# Patient Record
Sex: Male | Born: 1960 | Race: White | Hispanic: No | Marital: Married | Smoking: Never smoker
Health system: Southern US, Community
[De-identification: ages and names within clinical notes are randomized; demographics above are authoritative.]

## PROBLEM LIST (undated history)

## (undated) DIAGNOSIS — Z955 Presence of coronary angioplasty implant and graft: Secondary | ICD-10-CM

## (undated) DIAGNOSIS — I219 Acute myocardial infarction, unspecified: Secondary | ICD-10-CM

## (undated) DIAGNOSIS — I493 Ventricular premature depolarization: Secondary | ICD-10-CM

## (undated) DIAGNOSIS — I341 Nonrheumatic mitral (valve) prolapse: Secondary | ICD-10-CM

## (undated) DIAGNOSIS — I2510 Atherosclerotic heart disease of native coronary artery without angina pectoris: Secondary | ICD-10-CM

## (undated) HISTORY — PX: APPENDECTOMY: SHX54

## (undated) HISTORY — PX: CHOLECYSTECTOMY: SHX55

## (undated) HISTORY — DX: Ventricular premature depolarization: I49.3

## (undated) HISTORY — PX: HERNIA REPAIR: SHX51

## (undated) HISTORY — DX: Presence of coronary angioplasty implant and graft: Z95.5

## (undated) HISTORY — DX: Acute myocardial infarction, unspecified: I21.9

## (undated) HISTORY — PX: CARDIAC CATHETERIZATION: SHX172

## (undated) HISTORY — DX: Nonrheumatic mitral (valve) prolapse: I34.1

## (undated) HISTORY — PX: URETHRAL DILATION: SUR417

## (undated) HISTORY — PX: APPENDECTOMY (OPEN): SHX54

## (undated) HISTORY — DX: Atherosclerotic heart disease of native coronary artery without angina pectoris: I25.10

---

## 2013-07-26 DIAGNOSIS — I499 Cardiac arrhythmia, unspecified: Secondary | ICD-10-CM

## 2013-07-26 HISTORY — DX: Cardiac arrhythmia, unspecified: I49.9

## 2013-08-30 DIAGNOSIS — M5090 Cervical disc disorder, unspecified, unspecified cervical region: Secondary | ICD-10-CM | POA: Insufficient documentation

## 2013-08-30 DIAGNOSIS — I341 Nonrheumatic mitral (valve) prolapse: Secondary | ICD-10-CM | POA: Insufficient documentation

## 2013-08-30 DIAGNOSIS — N419 Inflammatory disease of prostate, unspecified: Secondary | ICD-10-CM | POA: Insufficient documentation

## 2013-08-30 DIAGNOSIS — J309 Allergic rhinitis, unspecified: Secondary | ICD-10-CM | POA: Insufficient documentation

## 2013-08-30 DIAGNOSIS — H9190 Unspecified hearing loss, unspecified ear: Secondary | ICD-10-CM | POA: Insufficient documentation

## 2013-08-30 DIAGNOSIS — I491 Atrial premature depolarization: Secondary | ICD-10-CM | POA: Insufficient documentation

## 2014-08-01 DIAGNOSIS — I4729 Other ventricular tachycardia: Secondary | ICD-10-CM | POA: Insufficient documentation

## 2014-08-01 DIAGNOSIS — I472 Ventricular tachycardia: Secondary | ICD-10-CM | POA: Insufficient documentation

## 2014-08-01 DIAGNOSIS — I493 Ventricular premature depolarization: Secondary | ICD-10-CM | POA: Insufficient documentation

## 2014-09-05 DIAGNOSIS — D472 Monoclonal gammopathy: Secondary | ICD-10-CM | POA: Insufficient documentation

## 2014-09-05 DIAGNOSIS — H5210 Myopia, unspecified eye: Secondary | ICD-10-CM | POA: Insufficient documentation

## 2015-01-17 ENCOUNTER — Ambulatory Visit (INDEPENDENT_AMBULATORY_CARE_PROVIDER_SITE_OTHER): Payer: Self-pay | Admitting: Cardiovascular Disease

## 2015-03-07 ENCOUNTER — Other Ambulatory Visit: Payer: Self-pay | Admitting: Cardiovascular Disease

## 2015-03-07 DIAGNOSIS — I341 Nonrheumatic mitral (valve) prolapse: Secondary | ICD-10-CM

## 2015-03-17 ENCOUNTER — Ambulatory Visit
Admission: RE | Admit: 2015-03-17 | Discharge: 2015-03-17 | Disposition: A | Discharge status: Home / Self Care | Payer: Commercial Managed Care - POS | Source: Ambulatory Visit | Attending: Cardiovascular Disease | Admitting: Cardiovascular Disease

## 2015-03-17 ENCOUNTER — Ambulatory Visit (INDEPENDENT_AMBULATORY_CARE_PROVIDER_SITE_OTHER): Payer: Self-pay

## 2015-03-17 ENCOUNTER — Other Ambulatory Visit (INDEPENDENT_AMBULATORY_CARE_PROVIDER_SITE_OTHER): Payer: Self-pay

## 2015-03-17 DIAGNOSIS — I341 Nonrheumatic mitral (valve) prolapse: Secondary | ICD-10-CM

## 2015-03-17 DIAGNOSIS — I493 Ventricular premature depolarization: Secondary | ICD-10-CM | POA: Insufficient documentation

## 2016-01-14 LAB — VAHRT HISTORIC LVEF: Ejection Fraction: 65 %

## 2016-01-15 ENCOUNTER — Ambulatory Visit (INDEPENDENT_AMBULATORY_CARE_PROVIDER_SITE_OTHER): Payer: Self-pay | Admitting: Cardiovascular Disease

## 2016-03-17 ENCOUNTER — Other Ambulatory Visit: Payer: Self-pay | Admitting: Cardiovascular Disease

## 2016-03-17 DIAGNOSIS — I341 Nonrheumatic mitral (valve) prolapse: Secondary | ICD-10-CM

## 2016-03-24 ENCOUNTER — Ambulatory Visit (INDEPENDENT_AMBULATORY_CARE_PROVIDER_SITE_OTHER): Payer: Self-pay

## 2016-03-24 ENCOUNTER — Ambulatory Visit
Admission: RE | Admit: 2016-03-24 | Discharge: 2016-03-24 | Disposition: A | Discharge status: Home / Self Care | Payer: Commercial Managed Care - POS | Source: Ambulatory Visit | Attending: Cardiovascular Disease | Admitting: Cardiovascular Disease

## 2016-03-24 DIAGNOSIS — I493 Ventricular premature depolarization: Secondary | ICD-10-CM | POA: Insufficient documentation

## 2016-03-24 DIAGNOSIS — I341 Nonrheumatic mitral (valve) prolapse: Secondary | ICD-10-CM | POA: Insufficient documentation

## 2016-11-02 ENCOUNTER — Ambulatory Visit (INDEPENDENT_AMBULATORY_CARE_PROVIDER_SITE_OTHER): Payer: Self-pay | Admitting: Nurse Practitioner

## 2017-03-01 ENCOUNTER — Ambulatory Visit (INDEPENDENT_AMBULATORY_CARE_PROVIDER_SITE_OTHER): Payer: Self-pay

## 2017-03-01 ENCOUNTER — Other Ambulatory Visit (INDEPENDENT_AMBULATORY_CARE_PROVIDER_SITE_OTHER): Payer: Self-pay

## 2017-03-14 ENCOUNTER — Other Ambulatory Visit (INDEPENDENT_AMBULATORY_CARE_PROVIDER_SITE_OTHER): Payer: Self-pay

## 2017-03-14 ENCOUNTER — Ambulatory Visit (INDEPENDENT_AMBULATORY_CARE_PROVIDER_SITE_OTHER): Payer: Self-pay

## 2017-06-09 LAB — VAHRT HISTORIC LVEF: Ejection Fraction: 60 %

## 2017-06-10 ENCOUNTER — Other Ambulatory Visit (INDEPENDENT_AMBULATORY_CARE_PROVIDER_SITE_OTHER): Payer: Self-pay

## 2017-06-10 ENCOUNTER — Ambulatory Visit (INDEPENDENT_AMBULATORY_CARE_PROVIDER_SITE_OTHER): Payer: Self-pay

## 2019-02-07 ENCOUNTER — Ambulatory Visit (INDEPENDENT_AMBULATORY_CARE_PROVIDER_SITE_OTHER): Payer: Self-pay | Admitting: Cardiovascular Disease

## 2019-03-14 ENCOUNTER — Ambulatory Visit (INDEPENDENT_AMBULATORY_CARE_PROVIDER_SITE_OTHER): Payer: Self-pay

## 2019-06-28 ENCOUNTER — Ambulatory Visit (INDEPENDENT_AMBULATORY_CARE_PROVIDER_SITE_OTHER): Payer: Self-pay | Admitting: Cardiovascular Disease

## 2020-06-18 ENCOUNTER — Encounter (INDEPENDENT_AMBULATORY_CARE_PROVIDER_SITE_OTHER): Payer: Self-pay

## 2020-06-23 ENCOUNTER — Encounter (INDEPENDENT_AMBULATORY_CARE_PROVIDER_SITE_OTHER): Payer: Self-pay | Admitting: Cardiovascular Disease

## 2020-06-24 ENCOUNTER — Encounter (INDEPENDENT_AMBULATORY_CARE_PROVIDER_SITE_OTHER): Payer: Self-pay | Admitting: Cardiovascular Disease

## 2020-06-24 ENCOUNTER — Telehealth (INDEPENDENT_AMBULATORY_CARE_PROVIDER_SITE_OTHER): Payer: Commercial Managed Care - POS | Admitting: Cardiovascular Disease

## 2020-06-24 VITALS — BP 120/80 | Ht 68.0 in | Wt 155.0 lb

## 2020-06-24 DIAGNOSIS — I341 Nonrheumatic mitral (valve) prolapse: Secondary | ICD-10-CM

## 2020-06-24 DIAGNOSIS — I493 Ventricular premature depolarization: Secondary | ICD-10-CM

## 2020-06-24 DIAGNOSIS — I4729 Other ventricular tachycardia: Secondary | ICD-10-CM

## 2020-06-24 DIAGNOSIS — I472 Ventricular tachycardia: Secondary | ICD-10-CM

## 2020-06-24 MED ORDER — PROPRANOLOL HCL 10 MG PO TABS
10.0000 mg | ORAL_TABLET | Freq: Two times a day (BID) | ORAL | 3 refills | Status: DC | PRN
Start: 2020-06-24 — End: 2020-07-07

## 2020-06-24 NOTE — Progress Notes (Signed)
HEART CARDIOLOGY OFFICE PROGRESS NOTE    [facility] OFFICE -CARDIOLOGY  [address]  [place] [address]  Dept: [phone]  Dept Fax: [phone]       Patient Name: Stephen Bishop, Stephen Bishop    Date of Visit:  June 24, 2020  Date of Birth: 1960/09/26  AGE: 59 y.o.  Medical Record #: [id number]  Requesting Physician: Driscilla Grammes, MD    Stephen Bishop presents for routine scheduled follow up.  He is a 59 y.o. M followed by Dr. Donnie Coffin.  H/o PVCs including prior ablation [facility]).  Some PVCs persist.  Also, MVP with MR.  Mildly thickened leaflets.  Normal LVEF.  At his last visit with Dr. Donnie Coffin his prior long acting propranolol was switched over to short acting (low dose) so it could be adjusted by Mr. Deleeuw as needed.      Since his last visit he is doing well.  Normal exercise tolerance.  Some palpitations at night.  Mild.  Not really using the beta-blocker at this point but does feel he would like a fresh prescription to have and fill if needed.  No complaints today.     PAST MEDICAL HISTORY: He has a past medical history of Arrhythmia (2015), Mitral valve prolapse, nonrheumatic, and Premature ventricular contraction. He has a past surgical history that includes Urethral dilation; Hernia repair; Appendectomy; Cholecystectomy; and Cardiac electrophysiology study and ablation (12/2014).    ALLERGIES:   Allergies   Allergen Reactions   . Sulfa Antibiotics Rash     Other reaction(s): Intolerance-unknown       MEDICATIONS:   No current outpatient medications on file.        FAMILY HISTORY: family history includes Atrial fibrillation in his father; Mitral valve prolapse in his father; Valve Surgery in his brother.    SOCIAL HISTORY: He reports that he has never smoked. He has never used smokeless tobacco. He reports previous alcohol use. He reports that he does not use drugs.    PHYSICAL EXAMINATION    Visit Vitals  BP 120/80   Ht 1.727 m (5\' 8" )    Wt 70.3 kg (155 lb)   BMI 23.57 kg/m     A/O X 3.  NAD.    No pallor or jaundice.  No JVD or TM.  Mood and affect WNL.  Speaking full sentences without dyspnea.  Moving all extremities.  No LE edema.      IMPRESSION:   1.   H/o PVCs, highly symptomatic, status post ablation at [facility] June 2016.     Documentation of PVCs and nonsustained ventricular tachycardia (up to five beats)    with right bundle/inferior axis morphology consistent with a left cusp versus aortomitral    continuity origin.  Intolerant of Bystolic, metoprolol, and Rythmol due to side effects.  Some likely     PVCs persist at night (possibly bradycardia dependent), particularly when under stress (possibly    catecholamine dependent).  Much lower level.  Not bothersome.  PRN beta-blocker.    2.   No known history of CAD.         a.   Coronary calcium scan 2014, total score zero.         b.   Normal stress nuclear study performed with another cardiologist in      [facility] January 2015 with normal EF.         c.   No anginal complaints.  3.  Mitral valve prolapse with MR.      a. Echocardiogram January 2015, bi-leaflet mitral valve      prolapse with moderate MR.  EF 60%.  Normal left atrial size.  Normal LV size.    b Echo 2016 EF - Normal. Bileaflet MVP. Mild to moderate MR.    c Echo 2017 EF 65% Moderate MR    d Echo 2018 EF 60% Mild to moderate MR    e Echo 2020 (interpreted by Dr. Hyacinth Meeker).  EF 60%. He notes mitral valve is minimally    thickened though reports no mitral valve prolapse.  Mild mitral regurgitation.    RECOMMENDATIONS:  Regarding his PVCs I will refill his prn propranolol 10 mg.  He requests 30 to use as a prn for a 3 month supply.  3 refills (1 year).  He will call if needing more.  Regarding his MVP I will arrange an echo.  Before the end of the year.  Once done, he will contact Dr. Donnie Coffin to review.  Otherwise, I will make the routine f/u visit with Dr. Donnie Coffin for a year.                                                    No orders of the defined types were placed in this encounter.      No orders of the defined types were placed in this encounter.      SIGNED:    Bertram Millard Ileigh Mettler, MD          This note was generated by the Dragon speech recognition and may contain errors or omissions not intended by the user. Grammatical errors, random word insertions, deletions, pronoun errors, and incomplete sentences are occasional consequences of this technology due to software limitations. Not all errors are caught or corrected. If there are questions or concerns about the content of this note or information contained within the body of this dictation, they should be addressed directly with the author for clarification.

## 2020-07-06 ENCOUNTER — Other Ambulatory Visit (INDEPENDENT_AMBULATORY_CARE_PROVIDER_SITE_OTHER): Payer: Self-pay | Admitting: Cardiovascular Disease

## 2020-07-06 DIAGNOSIS — I493 Ventricular premature depolarization: Secondary | ICD-10-CM

## 2020-07-06 DIAGNOSIS — I341 Nonrheumatic mitral (valve) prolapse: Secondary | ICD-10-CM

## 2020-07-10 ENCOUNTER — Telehealth (INDEPENDENT_AMBULATORY_CARE_PROVIDER_SITE_OTHER): Payer: Self-pay

## 2020-07-10 MED ORDER — ACETAZOLAMIDE 125 MG PO TABS
125.0000 mg | ORAL_TABLET | Freq: Two times a day (BID) | ORAL | 3 refills | Status: DC | PRN
Start: 2020-07-10 — End: 2020-12-31

## 2020-07-10 NOTE — Addendum Note (Signed)
Addended by: Ellard Artis. on: 07/10/2020 03:57 PM     Modules accepted: Orders

## 2020-07-10 NOTE — Telephone Encounter (Signed)
Mr. Stephen Bishop called to have his Acetazolamide 125mg  BID 1 day before reaching high altitude refilled because he is going back to [place] soon, specifically to [place], [place] which is approximately 9,400 feet above sea level.     When he goes to high altitudes, he gets tachy/ mountain sickness and has frequent PVC's so Dr. Donnie Coffin rx'ed this for him.      It was initially rx'ed on Jun 24, 2015 by Dr. Donnie Coffin when he went to [place].  (Please see GEMMS MR [id number].  GEMMS Message 06/23/15 16:55:55 between RN and LRR )    Dr. Morrie Sheldon whom he saw recently is not in clinic today.      I will ask our AOD if she can give me orders.

## 2020-07-10 NOTE — Telephone Encounter (Signed)
Relayed msg to pt.  Pt voiced understanding.

## 2020-07-10 NOTE — Telephone Encounter (Signed)
I looked at his recent labs which electrolytes are normal and creatinine hasn't changed much since 2016. Okay for same prescription acetazolamide 125mg  bid starting 1 day prior. I agree with recommendations back in 2016 for monitoring for adequate hydration and also limiting alcohol intake.  Thanks!

## 2020-07-17 ENCOUNTER — Encounter (INDEPENDENT_AMBULATORY_CARE_PROVIDER_SITE_OTHER): Payer: Self-pay | Admitting: Cardiovascular Disease

## 2020-07-17 ENCOUNTER — Ambulatory Visit (INDEPENDENT_AMBULATORY_CARE_PROVIDER_SITE_OTHER): Payer: Commercial Managed Care - POS

## 2020-07-17 DIAGNOSIS — I341 Nonrheumatic mitral (valve) prolapse: Secondary | ICD-10-CM

## 2020-07-17 DIAGNOSIS — I493 Ventricular premature depolarization: Secondary | ICD-10-CM

## 2020-07-18 ENCOUNTER — Other Ambulatory Visit (INDEPENDENT_AMBULATORY_CARE_PROVIDER_SITE_OTHER): Payer: Self-pay | Admitting: Cardiovascular Disease

## 2020-07-18 DIAGNOSIS — I493 Ventricular premature depolarization: Secondary | ICD-10-CM

## 2020-07-18 DIAGNOSIS — I341 Nonrheumatic mitral (valve) prolapse: Secondary | ICD-10-CM

## 2020-08-06 ENCOUNTER — Telehealth (INDEPENDENT_AMBULATORY_CARE_PROVIDER_SITE_OTHER): Payer: Self-pay | Admitting: Cardiovascular Disease

## 2020-10-28 NOTE — Progress Notes (Signed)
 OFFICE  [address] [place], [place] [address]     TELEMEDICINE VISIT       Stephen Bishop    Date of Visit:  02/07/2019  Date of Birth: 03-16-1961  Age: 60 yrs.   Medical Record Number: [id number]  __  CURRENT DIAGNOSES     1. Mitral valve prolapse nonrheumatic,  I34.1  2. 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 Vitamin C 1,000 mg tablet, 1 po qd  2. Vitamin D3 1,000 unit tablet, 1 po qd  3. magnesium 250 mg tablet, 2 po qd   4. Citracal + Bone Density 300 mg-200 unit-13.5 mg tablet, 2 po q d  5. propranolol ER 60 mg capsule,24 hr,extended release, 1 po qd  __  CHIEF COMPLAINT/REASON FOR VISIT   Followup of Mitral valve prolapse nonrheumatic and Followup of PVCs  __  HISTORY OF PRESENT ILLNESS  The visit today was conducted via telemedicine  due to COVID-19 precautions. The patient was in their home and was informed and gave verbal consent to proceed. Mr. Debruhl was evaluated by telemedicine. He reports very significant work-related stress, and in this setting noted increase in his palpitations.  He was started on extended release propranolol by his primary care physician. He is also interested in a followup echocardiogram. He has not been exercising regularly.   __  PHYSICAL EXAMINATION     Vital Signs:  Blood Pressure:      Weight:  160.00 lbs.  Height: 68.00"  BMI: 24.33    Pulse: 66/min.       IMPRESSIONS:  Telemedicine follow-up visit. Increased palpitations noted  in the setting of significant work related stress.   Restarted on propranolol ER by his primary care physician.    1. PVCs, highly symptomatic status post ablation at [facility] June 2016.   Documentation of PVCs and nonsustained  ventricular tachycardia (up to five beats)   with right bundle/inferior axis morphology consistent with a left cusp versus aortomitral   continuity origin. Intolerant of Bystolic, metoprolol, and Rythmol due  to side effects.  2. No known history  of CAD.  a. Coronary calcium scan 2014, total score zero.  b. Normal stress nuclear study performed with another cardiologist in   [facility] January 2015 with normal EF.  c. No anginal complaints.  3. Mitral valve prolapse  with MR.   a. Echocardiogram January 2015, bi-leaflet mitral valve   prolapse with moderate MR. EF 60%. Normal left atrial size. Normal LV size.  b Echo 2016 EF - Normal. Bileaflet MVP. Mild to moderate MR.  c Echo 2017 EF 65% Moderate  MR  d Echo 2018 EF 60% Mild to moderate MR    RECOMMENDATIONS:  1. Continue propranolol as prescribed, though would change dosing to later in the day as he notes more prominent symptoms overnight.   2. Regular exercise encouraged.    3. Echocardiogram (at patient request).  4. Return visit in one year or sooner as needed.     Evianna Chandran R. Donnie Coffin, MD, [facility]    LRR/tumam     cc: Driscilla Grammes MD  ____________________________  TODAYS ORDERS   2D, color flow, doppler echocardiogram [facility]  Return Visit 30 MIN 1 year

## 2020-10-28 NOTE — Progress Notes (Signed)
Stephen Bishop OFFICE  [address] [place], [place] [address]     Stephen Bishop    Date of Visit:  11/02/2016  Date of Birth: [birth year]  Age: 60 yrs.   Medical Record Number: [id number]  __  CURRENT DIAGNOSES     1. Mitral valve prolapse nonrheumatic,  I34.1  2. 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 Vitamin C 1,000 mg tablet, 1 po qd  2. Vitamin D3 1,000 unit tablet, 1 po qd  3. magnesium 250 mg tablet, 2 po qd   4. acetazolamide 125 mg tablet, 1 po BID start 1 day before reaching altitude  5. multivitamin tablet, 1 po q d  6. Citracal + Bone Density 300 mg-200 unit-13.5 mg tablet, 2 po q d  __   CHIEF COMPLAINT/REASON FOR VISIT  Followup of PVCs  __  HISTORY OF PRESENT ILLNESS  Stephen Bishop  is a 60 year old gentleman who comes to the office today with a symptom complaint of increased premature ventricular contractions. He is primarily followed by Dr. Michell Heinrich for a cardiac history to include mitral valve prolapse with premature ventricular  contractions status post EP study and left ventricular outflow tract ablation at [facility] December 30, 2014.     Stephen Bishop tells me that he has noticed increased frequency of his premature ventricular contractions. At times he feels  a trigeminal pattern, and if that is the case, he notices increased shortness of breath. He denies chest pain, lightheadedness or dizziness. He appears euvolemic on physical exam.   Stephen Bishop most recent echocardiogram, completed on March 24, 2016, showed normal left ventricular systolic function with left a ventricular ejection fraction of 60%, mild mitral valve prolapse, moderate mitral regurgitation, grade I diastolic  dysfunction. Blood pressure at the office today was 124 to 130 over 80. A 12-lead ECG showed normal sinus rhythm at a rate of 76 without ectopy. Labs that the patient had with him from September 22, 2016 were  reviewed, showing a sodium level of 140, chloride  of 102, potassium of 4.3, CO2 of 24, BUN of 18, creatinine 1.3 and magnesium level of 2.4.     PAST HISTORY      Past Medical Illnesses: No previous history of significant medical illnesses.;  Past Cardiac Illnesses : PVC's, MVP; Infectious Diseases: Chicken Pox; Surgical Procedures : Urethral stricture, Hernia,Appendectomy; Trauma History: No previous history of significant trauma.;  Cardiology Procedures-Invasive: Ablation; Cardiology Procedures-Noninvasive: MPI (Nuclear) Study;  Left Ventricular Ejection Fraction: LVEF of 65% documented via echocardiogram on 03/17/2015  ___   FAMILY HISTORY  Brother -- Repair of mitral valve   Father -- Atrial fibrillation, Mitral valve regurgitation    __  CARDIAC RISK FACTORS      Tobacco Abuse: has never used tobacco; Family History of Heart Disease: positive;  Hyperlipidemia: negative; Hypertension: negative;   Diabetes Mellitus: negative; Prior History of Heart Disease: positive;  Obesity: negative; Sedentary Life Style:negative; Age :positive; Menopausal:not applicable  __  SOCIAL HISTORY     Alcohol Use: drinks occasionally and socially; Smoking: Does not smoke; Never smoker ([id number]);  Diet: Regular diet; Exercise: Exercises regularly three times a week;   __   PHYSICAL EXAMINATION    Vital Signs:  Blood Pressure:   130/80 Sitting, Left arm, regular cuff  124/80 Sitting, Right arm, regular cuff    Weight: 166.00 lbs.   Height: 68"  BMI:  25   Pulse:  76/min.   Respirations: 14/min.     Constitutional:  Well developed, well nourished, in no acute distress Skin: warm and dry to touch Head : normocephalic, normal male hair pattern Eyes: conjunctivae and lids normal  ENT: Ears, Nose and throat reveal no gross abnormalities Neck : no JVD, no bruits Chest: clear to auscultation bilaterally  Cardiac: Regular rhythm, S1 normal, S2 normal, No S3 or S4, Apical impulse not displaced, no murmurs, gallops or rubs detected. Abdomen :  unremarkable Peripheral Pulses: pulses full and equal in all extremities  Extremities/Back: no edema present Neurological: oriented  to time, person and place _    Medications added today by the physician:    IMPRESSIONS:   1. Premature ventricular contractions, symptomatic, status post ablation at [facility] is June of 2016.   a. Documentation of premature ventricular contractions nonsustained ventricular   tachycardia up to five beats  with right bundle/inferior axis morphology   consistent with left cusp versus aortomitral continuity origin.   b. Intolerance of Bystolic, metoprolol and Rythmol secondary to undesired side   effects.   c. The patient has noticed increased  frequency and duration of palpitations of late.   2. Mitral valve prolapse with mitral regurgitation.   a. Echocardiogram in January of 2015 showing a bileaflet mitral valve prolapse   and moderate mitral regurgitation. Ejection fraction 60%.  Normal left atrial size,   normal left ventricular size.   b. Echocardiogram August of 2016 again showing bileaflet mitral valve prolapse,   mild-to-moderate mitral regurgitation, normal left ventricular ejection fraction.      PLAN AND RECOMMENDATIONS:  1. Stephen Bishop will have a Holter monitor placed today.   2. It was recommended that he discontinue any caffeine consumption and maintain adequate hydration.  3.  Consider repeat echocardiogram prior to annual surveillance (August 2018) pending results of Holter.     Stephen Bishop, AGACNP-BC    ED/cbh     cc: Driscilla Grammes MD    SHJ  ____________________________  TODAYS ORDERS   Holter Monitor Today  Diet mgmt edu, guidance and counseling TODAY  12 Lead ECG Today

## 2020-10-28 NOTE — H&P (Signed)
[place] OFFICE  [address] [place], [place] [address]     Valentina Shaggy    Date of Visit:  01/17/2015  Date of Birth: February 04, 1961  Age: 60 yrs.   Medical Record Number: [id number]  Referring Physician: [facility] MD, Stephanie Coup.  __   CURRENT DIAGNOSES     1. Mitral valve prolapse nonrheumatic, I34.1  2. PVCs, I49.3  __  ALLERGIES     Sulfa (Sulfonamide Antibiotics), Intolerance-unknown  __  MEDICATIONS     1. Vitamin C 1,000  mg tablet, 1 po qd  2. Vitamin D3 1,000 unit tablet, 1 po qd  3. magnesium 250 mg tablet, 2 po qd  __  CHIEF COMPLAINT/REASON FOR VISIT   Followup of PVCs and Mitral Valve Prolapse  __  HISTORY OF PRESENT ILLNESS  Mr. Pilar is a 60 year old man seen in the office to establish  ongoing cardiac care. He has a history of PVCs status post ablation at [facility] and has a history of mitral valve prolapse with moderate MR noted by most recent echocardiogram. Following his recent ablation, he reports that he is feeling extremely  well. He had noted frequent palpitations though reports PVCs are very rare. He had been seen by multiple cardiologists and had been tried on medications including Bystolic, metoprolol, and Rythmol though he did not tolerate these medications and in the  setting of continued symptomatic palpitations underwent successful ablation. He does exercise. He has not had exertional chest pain or shortness of breath and denies PND, orthopnea, or claudication. Overall, he is feeling well.    _   PAST HISTORY     Past Medical Illnesses: No previous history of significant medical illnesses.;   Past Cardiac Illnesses: PVC's, MVP; Infectious Diseases : Chicken Pox; Surgical Procedures: Urethral stricture, Hernia,Appendectomy; Trauma History : No previous history of significant trauma.; Cardiology Procedures-Invasive: Ablation; Cardiology  Procedures-Noninvasive: MPI (Nuclear) Study  ___  FAMILY HISTORY   Brother -- Repair of mitral valve  Father -- Atrial  fibrillation, Mitral valve regurgitation     __  CARDIAC RISK FACTORS     Tobacco Abuse: has never used tobacco; Hyperlipidemia : negative; Hypertension: negative;  Diabetes Mellitus : negative; Prior History of Heart Disease: positive; Obesity : negative; Sedentary Life Style:negative; [id number]   __  SOCIAL HISTORY    Alcohol Use : drinks occasionally and socially; Smoking: Does not smoke; Never smoker ([id number]); Diet : Regular diet; Exercise: Exercises regularly three times a week;   __  REVIEW OF SYSTEMS     General: Denies recent weight loss, weight gain, fever or chills or change in exercise tolerance.; Integumentary : Denies any change in hair or nails, rashes, or skin lesions.; Eyes: wears eye glasses/contact lenses;  Ears, Nose, Throat, Mouth: Denies any hearing loss, epistaxis, hoarseness or difficulty speaking.;Respiratory : Denies dyspnea, cough, wheezing or hemoptysis.; Cardiovascular: Please review HPI; Abdominal  : Denies ulcer disease, hematochezia or melena.;Musculoskeletal:Denies any venous insufficiency, arthritic symptoms or back problems.;  Neurological : Denies any recurrent strokes, TIA, or seizure disorder.; Psychiatric: Denies any depression,  substance abuse or change in cognitive functions.; Endocrine: Denies any weight change, heat/cold intolerance, polydipsia, or polyuria;  Hematologic/Immunologic: Denies any food allergies, seasonal allergies, bleeding disorders.  __  PHYSICAL EXAMINATION     Vital Signs:  Blood Pressure:  124/80 Sitting, Right arm, regular cuff  118/80 Sitting, Left arm, regular cuff     Weight: 158.00 lbs.  Height: 68"  BMI:  24   Pulse: 72/min.  Constitutional:  Well developed, well nourished, in no acute distress  Head: normocephalic  Eyes: conjunctivae and lids normal ENT: Ears, Nose and throat reveal no gross abnormalities  Neck: no JVD, no bruits Chest: clear to auscultation bilaterally  Cardiac: Regular rhythm, S1 normal, S2 normal, No S3 or S4, Apical  impulse not displaced, no murmurs, gallops or rubs detected. Abdomen : abdomen normal, abdomen soft, bowel sounds normoactive, no abnormal aortic pulsation, non-tender, no bruits Peripheral Pulses: pulses full and equal  in all extremities Extremities/Back: no edema present Neurological : oriented to time, person and place   __    Medications added today by the physician:      ECG:  Normal sinus rhythm.     IMPRESSIONS:  1. PVCs, highly symptomatic status post ablation at [facility] June 2016.   Documentation of PVCs and nonsustained ventricular tachycardia (up to five beats)   with right bundle/inferior axis morphology consistent  with a left cusp versus aortomitral   continuity origin. Intolerant of Bystolic, metoprolol, and Rythmol due to side effects.  2. No known history of CAD.  a. Coronary calcium scan 2014, total score zero.  b. Normal stress nuclear study  performed with another cardiologist in   [facility] January 2015 with normal EF.  c. No anginal complaints.  3. Mitral valve prolapse with MR. Echocardiogram January 2015, bi-leaflet mitral valve   prolapse with moderate MR. EF  60%. Normal left atrial size. Normal LV size.    RECOMMENDATIONS:  1. Echocardiogram to reassess MR.   2. Anticipate yearly echo studies as requested by Mr. Plack.    Varnell Orvis R. Donnie Coffin, MD, [facility]     Tid: [id number]     cc: Driscilla Grammes MD    rw      ____________________________  TODAYS ORDERS  12 Lead ECG Today  Patient Electronic  Access Today  2D, color flow, doppler First Available        Michell Heinrich, MD, [facility]

## 2020-10-28 NOTE — Progress Notes (Signed)
[facility]  [address] [place], [place] [address]     Valentina Shaggy    Date of Visit:  06/10/2017  Date of Birth: May 30, 1961  Age: 60 yrs.   Medical Record Number: [id number]  __  CURRENT DIAGNOSES     1. Mitral valve prolapse nonrheumatic,  I34.1  2. 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 acetazolamide 125 mg tablet, 1 po BID start 1 day before reaching altitude  2. Citracal + Bone Density 300 mg-200 unit-13.5  mg tablet, 2 po q d  3. magnesium 250 mg tablet, 2 po qd  4. propranolol 10 mg tablet, 1 po q8h  5. Vitamin C 1,000 mg tablet, 1 po qd  6. Vitamin D3 1,000 unit tablet, 1 po qd  __   CHIEF COMPLAINT/REASON FOR VISIT  Followup of PVCs  __  HISTORY OF PRESENT ILLNESS  Stephen Bishop  is a 60 year old male who returns for electrophysiology followup. The patient has a history of mitral valve prolapse with an accelerating burden of symptomatic PVCs, with a 17% burden on Holter monitor. He was seen by two electrophysiologists in the [facility] and ultimately was self referred to [facility] where he was taken to the EP lab by Dr. Nena Polio. PVCs were mapped to the superolateral aspect of the mitral valve ring. An ablation was performed at the site as well as reinforcing lesions  on the overlying coronary sinus. He noted dramatic improvement of symptoms following the ablation. I saw him in the summer because he was having increase of palpitations with a Holter monitor showing a 4.0% PVC burden. He was started on propranolol and  his symptoms dissipated. When I saw the patient we recommended discontinuation of propranolol as well as a repeat echocardiogram. His echocardiogram shows ongoing preserved LV function. He is here for followup. The patient describes that with discontinuation  of propranolol he had worsening palpitations again and had restarted with a taper and is now on 10 mg, taking it at  night. He has rare, episodic palpitations lasting seconds, usually at night, but overall feels great on a day to day basis. All other review  of systems is negative.  __  PAST HISTORY     Past Medical Illnesses : No previous history of significant medical illnesses.;  Past Cardiac Illnesses: PVC's, MVP;  Infectious Diseases: Chicken Pox; Surgical Procedures: Urethral stricture, Hernia,Appendectomy;  Trauma History: No previous history of significant trauma.; Cardiology Procedures-Invasive: Ablation;  Cardiology Procedures-Noninvasive: MPI (Nuclear) Study, Echocardiogram August 2018; Left Ventricular Ejection Fraction : LVEF of 60% documented via echocardiogram on 03/14/2017  PHYSICAL EXAMINATION    Vital Signs :  Blood Pressure:  128/82 Sitting, Left arm, regular cuff  122/90 Sitting, Right arm, regular cuff     Weight: 165.00 lbs.  Height: 68.00"  BMI:  25   Pulse: 70/min. apical       Constitutional:  Well developed, well nourished, in no acute distress Skin: warm and dry to touch Head : normocephalic, normal male hair pattern Eyes: conjunctivae and lids normal  ENT: Ears, Nose and throat reveal no gross abnormalities Neck : no JVD, no bruits Chest: clear to auscultation bilaterally  Cardiac: Regular rhythm, S1 normal, S2 normal, No S3 or S4, Apical impulse not displaced, no murmurs, gallops or rubs detected. Abdomen : unremarkable Peripheral Pulses: pulses full and equal in all extremities  Extremities/Back:  no edema present Neurological: oriented  to time, person and place   __    Medications added today by the physician:      IMPRESSIONS:    1. High-grade ventricular ectopy, status post ablation at [facility] in 2016; premature   ventricular contraction mapped to the superolateral mitral valve annulus.  2. Ongoing palpitations with fluctuating premature ventricular contractions  with a right   bundle, left superior axis on electrocardiogram.  3. Mitral valve prolapse with moderate mitral  regurgitation.    ASSESSMENT AND PLAN:   It was a pleasure to see the patient back in electrophysiology followup. He remains on a low dose of beta-blocker and overall is doing well with minimal symptomology. He has preserved LV function with mild to moderate mitral regurgitation. At this point,  given his minimal symptoms and low-grade ventricular ectopy on prior Holter monitor, I would not recommend any treatment at this time. Certainly if he has an accelerating burden of symptoms we can revisit antiarrhythmic drugs or ablation, but at the current  time I would continue to stay the course. The patient will follow up as needed.    Stephen Morgan Amado Coe, MD    ASF/tutbh    cc: Driscilla Grammes MD    AP

## 2020-10-28 NOTE — Consults (Signed)
[facility]  [address] [place], [place] [address]     Stephen Bishop    Date of Visit:  03/01/2017  Date of Birth: 29-Nov-1960  Age: 60 yrs.   Medical Record Number: [id number]  __  CURRENT DIAGNOSES     1. Mitral valve prolapse nonrheumatic,  I34.1  2. 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 acetazolamide 125 mg tablet, 1 po BID start 1 day before reaching altitude  2. Citracal + Bone Density 300 mg-200 unit-13.5  mg tablet, 2 po q d  3. magnesium 250 mg tablet, 2 po qd  4. propranolol ER 60 mg capsule,24 hr,extended release, 1 po qd  5. Vitamin C 1,000 mg tablet, 1 po qd  6. Vitamin D3 1,000 unit tablet, 1 po qd  __   CHIEF COMPLAINT/REASON FOR VISIT  ep consult : PVC's  __  HISTORY OF PRESENT ILLNESS  The patient  is a 60 year old male who is referred to me in electrophysiology consultation by Dr. Michell Heinrich regarding symptomatic premature ventricular contractions. The patient has a history of mitral valve prolapse as well as an accelerating burden of symptomatic  premature ventricular contractions with 70% burden on Holter monitor. He was seen by two electrophysiologists in the [facility] and ultimately self-referred to [facility] where he was taken to the electrophysiology laboratory by Dr. Nena Polio.  Premature ventricular contractions were mapped to the superior lateral aspect of the mitral valve ring. An ablation was performed at the site as well as reinforcing a lesion overlying the coronary sinus. The patient noted a dramatic improvement of symptoms  following the ablation. Over the last number of months, he has had increasing episodes of palpitations. These will have periods of quiescence and other times when he is ventricular bigeminy and trigeminy. He had a Holter monitor in April that showed a  4% burden, but called in last month with worsening palpitations and he was started on  propranolol. His symptoms have dissipated. He has noted lack of sleep and stress to be a particular trigger. He has a history of mitral valve prolapse with an echocardiogram  one year ago with mild prolapse of moderate mitral regurgitation. He has no history of presyncope or frank syncope or chest pain. All other review of systems is negative.   __  PAST HISTORY      Past Medical Illnesses: No previous history of significant medical illnesses.;  Past Cardiac Illnesses : PVC's, MVP; Infectious Diseases: Chicken Pox; Surgical Procedures : Urethral stricture, Hernia,Appendectomy; Trauma History: No previous history of significant trauma.;  Cardiology Procedures-Invasive: Ablation; Cardiology Procedures-Noninvasive: MPI (Nuclear) Study;  Left Ventricular Ejection Fraction: LVEF of 65% documented via echocardiogram on 03/17/2015  PHYSICAL  EXAMINATION    Vital Signs:  Blood Pressure:  120/74 Sitting, Left arm, regular cuff   118/70 Sitting, Right arm, regular cuff    Weight: 163.00 lbs.  Height:  68.00"  BMI: 25   Pulse: 56/min. Apical Regular    Respirations: 16/min.       Constitutional: Well developed, well nourished, in no acute distress  Skin: warm and dry to touch Head: normocephalic, normal male hair pattern  Eyes: conjunctivae and lids normal ENT: Ears, Nose and throat reveal no gross abnormalities  Neck: no JVD, no bruits Chest: clear to auscultation bilaterally  Cardiac: Regular rhythm, S1 normal, S2 normal, No S3 or S4, Apical impulse  not displaced, no murmurs, gallops or rubs detected. Abdomen : unremarkable Peripheral Pulses: pulses full and equal in all extremities Extremities/Back : no edema present Neurological: oriented to time, person and place   __    Medications  added today by the physician:    ECG:  Sinus rhythm with a single isolated premature ventricular contraction that shows a monophasic R wave right bundle morphology with a V4 precordial transition  and a left superior axis.      IMPRESSIONS:  1. High-grade ventricular ectopy status post ablation at [facility] in 2016 at the superior lateral   mitral valve annulus.   2. Ongoing palpitations with fluctuating premature ventricular  contractions with a right bundle, left   superior axis morphology on todays visit.   3. Mild mitral valve prolapse with moderate mitral regurgitation.     RECOMMENDATIONS:   It was a pleasure to see the patient in electrophysiology  follow up. We had a lengthy discussion regarding premature ventricular contractions and his management consideration. He seems to have an increasing burden of premature ventricular contractions with a single isolated premature ventricular contraction  on todays visit distinctly different that what was ablated in the past. We reviewed management strategies and reasons for intervention including associated myopathy or symptomatology. I would like to get a repeat echocardiogram to evaluate for any worsening  of his mitral regurgitation especially with the worsening ectopy. For the time being, his symptoms have abated and we will continue him on his current therapy. If he continues to have worsening symptoms of palpitations, I reviewed the pros and cons of  anti-arrhythmic drug versus ablation with the patient favoring an attempted ablation. I explained that this would only be worthwhile if he had a high enough burden that a trip to the laboratory would increase the likelihood of success. I will plan to  see the patient in three months time, but he knows to call in if his symptoms were to change.     Jarvis Morgan Amado Coe, M.D.    ASF/tubks    cc: Driscilla Grammes MD    el    ____________________________  TODAYS ORDERS  2D, color flow, doppler 1 month  12 Lead ECG Today  EP Return Visit 30 minutes 3 months

## 2020-10-28 NOTE — Progress Notes (Signed)
[facility]  [address] [place], [place] [address]     Valentina Shaggy    Date of Visit:  01/15/2016  Date of Birth: Mar 10, 1961  Age: 60 yrs.   Medical Record Number: [id number]  __  CURRENT DIAGNOSES     1. Mitral valve prolapse nonrheumatic,  I34.1  2. 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 Vitamin C 1,000 mg tablet, 1 po qd  2. Vitamin D3 1,000 unit tablet, 1 po qd  3. magnesium 250 mg tablet, 2 po qd   4. acetazolamide 125 mg tablet, 1 po BID start 1 day before reaching altitude  5. multivitamin tablet, 1 po q d  6. Citracal + Bone Density 300 mg-200 unit-13.5 mg tablet, 2 po q d  __   CHIEF COMPLAINT/REASON FOR VISIT  Followup of Mitral valve prolapse nonrheumatic and Followup of PVCs  __  HISTORY OF PRESENT ILLNESS   Mr. Crumpler is in the office for a followup visit. Overall, he is feeling well. He does note occasional episodes of palpitations though none are sustained, and there are no other associated symptoms. Overall, he is feeling well. He has not had chest  pain or shortness of breath. He is interested in repeating an echocardiogram later this summer.  __  PHYSICAL EXAMINATION     Vital Signs:  Blood Pressure:  122/80 Sitting, Right arm, regular cuff  130/80 Sitting, Left arm,  regular cuff    Weight: 158.00 lbs.  Height: 68"   BMI: 24   Pulse: 74/min.        Constitutional: Well developed, well nourished, in no acute distress Head:  normocephalic Eyes: conjunctivae and lids normal ENT : Ears, Nose and throat reveal no gross abnormalities Neck:  no JVD, no bruits Chest: clear to auscultation bilaterally Cardiac : Regular rhythm, S1 normal, S2 normal, No S3 or S4, Apical impulse not displaced, no murmurs, gallops or rubs detected. Abdomen: abdomen normal, abdomen  soft, bowel sounds normoactive, no abnormal aortic pulsation, non-tender, no bruits Peripheral Pulses:  pulses full and equal in all extremities  Extremities/Back: no edema present Neurological : oriented to time, person and place     ECG:   Normal sinus rhythm. No significant ST or T-wave  changes.    IMPRESSIONS:  Followup visit - doing well.    1. PVCs, highly symptomatic status post ablation at [facility] June  2016.   Documentation of PVCs and nonsustained ventricular tachycardia (up to five beats)   with right bundle/inferior axis morphology consistent with a left cusp versus aortomitral   continuity origin. Intolerant of Bystolic, metoprolol,  and Rythmol due to side effects.  No significant palpitations currently noted.  2. No known history of CAD.  a. Coronary calcium scan 2014, total score zero.  b. Normal stress nuclear study performed with another cardiologist in    [facility] January 2015 with normal EF.  c. No anginal complaints.  3. Mitral valve prolapse with MR.   a. Echocardiogram January 2015, bi-leaflet mitral valve   prolapse with moderate MR. EF 60%. Normal left atrial size. Normal  LV size.  b Echo 2016 EF - Normal. Bileaflet MVP. Mild to moderate MR.      RECOMMENDATIONS:    1. Echocardiogram, late summer/early fall 2017, for continued monitoring of his   mitral valve prolapse and mitral regurgitation.  2. Anticipate yearly visits and likely  yearly echo studies as requested by   Mr. Skog.     Giovan Pinsky R. Donnie Coffin, MD, [facility]     Tid: [id number]:    cc: Driscilla Grammes MD    jf  ____________________________   Christianne Dolin  VO:[id number] Education ICD-10: I49.3 MedlinePlus Connect results for ICD-10 I49.3  12 Lead ECG Today  2D, color flow, doppler [facility]

## 2020-10-28 NOTE — Progress Notes (Signed)
[facility]  [address] [place], [place] [address]     TELEMEDICINE VISIT       Valentina Shaggy    Date of Visit:  06/28/2019  Date of Birth: 02-02-1961  Age: 60 yrs.   Medical Record Number: [id number]  __  CURRENT DIAGNOSES     1. Mitral valve prolapse nonrheumatic,  I34.1  2. 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 Citracal + Bone Density 300 mg-200 unit-13.5 mg tablet, 2 po q d  2. magnesium 250 mg tablet, 2 po qd  3. propranolol  10 mg tablet, 1 po TID  4. propranolol ER 60 mg capsule,24 hr,extended release, 1 po qd  5. Vitamin D3 1,000 unit tablet, 1 po qd  __  CHIEF COMPLAINT/REASON FOR VISIT   Followup of Mitral valve prolapse nonrheumatic and Followup of PVCs  __  HISTORY OF PRESENT ILLNESS  The visit today was conducted via telemedicine  due to COVID-19 precautions. The patient was in their home and was informed and gave verbal consent to proceed. Mr. Shuler is evaluated by telemedicine. He would like to wean himself back off propranolol ER. Overall, he is feeling well. He does report  a period of significant work related stress and increased palpitations in this setting. More recently, he is doing well.   __  PHYSICAL EXAMINATION     Vital Signs:  Blood Pressure:  120/70 Average home readings     Weight: 170.00 lbs.  Height: 68.00"  BMI:  25.85       Constitutional: Well developed,  well nourished, in no acute distress  Head: normocephalic  Eyes: conjunctivae and lids normal ENT: Ears, Nose and throat reveal no gross abnormalities  Chest: Respirations unlabored Neurological: oriented to time, person and place        IMPRESSIONS:  Telemedicine follow-up visit. He would like to wean himself off of propranolol ER. Overall feels he is doing reasonably well.    1. PVCs, highly symptomatic status post ablation at [facility] June 2016.   Documentation  of PVCs and nonsustained ventricular tachycardia  (up to five beats)   with right bundle/inferior axis morphology consistent with a left cusp versus aortomitral   continuity origin. Intolerant of Bystolic, metoprolol, and Rythmol due to side effects.   2. No known history of CAD.  a. Coronary calcium scan 2014, total score zero.  b. Normal stress nuclear study performed with another cardiologist in   [facility] January 2015 with normal EF.  c. No anginal complaints.  3. Mitral  valve prolapse with MR.   a. Echocardiogram January 2015, bi-leaflet mitral valve   prolapse with moderate MR. EF 60%. Normal left atrial size. Normal LV size.  b Echo 2016 EF - Normal. Bileaflet MVP. Mild to moderate MR.  c Echo 2017  EF 65% Moderate MR  d Echo 2018 EF 60% Mild to moderate MR  e Echo 2020 (interpreted by Dr. Hyacinth Meeker). EF 60%. He notes mitral valve is minimally thickened   though reports no mitral valve prolapse. Mild mitral regurgitation.    RECOMMENDATIONS:   1. As he is currently on propranolol ER 60 mg daily, we will change to short-acting propranolol 10 mg. He will start by taking it t.i.d. and then decrease the dose from there.   2. Routine follow up which is anticipated next summer or sooner as needed.  Jaleah Lefevre R. Donnie Coffin, M.D., F.A.C.C.   LRR/tubks    cc: Driscilla Grammes MD

## 2020-12-29 ENCOUNTER — Telehealth (INDEPENDENT_AMBULATORY_CARE_PROVIDER_SITE_OTHER): Payer: Self-pay

## 2020-12-29 NOTE — Telephone Encounter (Signed)
Pt called and stated he has been using his propanolol 10mg  more often lately. He has been taking it daily over the past 5 days. He states he originally began taking the medication as he was having sensations that his heart was "summersalting" or pounding hard typically at night, also making him feel SOB at times. He ended up taking the 10mg  bid on Saturday. Yesterday (Sunday) and today he now feels as though his heart is skipping a beat, sometimes feeling lightheaded. Pt has hx of PVCs and NSVT. Offered pt an appt tomorrow AM, pt unable to make appt, pt agreed with appt on Wednesday.

## 2020-12-31 ENCOUNTER — Ambulatory Visit (INDEPENDENT_AMBULATORY_CARE_PROVIDER_SITE_OTHER): Payer: Commercial Managed Care - POS | Admitting: Nurse Practitioner

## 2020-12-31 ENCOUNTER — Ambulatory Visit (INDEPENDENT_AMBULATORY_CARE_PROVIDER_SITE_OTHER): Payer: Commercial Managed Care - POS

## 2020-12-31 ENCOUNTER — Encounter (INDEPENDENT_AMBULATORY_CARE_PROVIDER_SITE_OTHER): Payer: Self-pay | Admitting: Nurse Practitioner

## 2020-12-31 VITALS — BP 138/92 | HR 66 | Ht 68.0 in | Wt 162.4 lb

## 2020-12-31 DIAGNOSIS — I493 Ventricular premature depolarization: Secondary | ICD-10-CM

## 2020-12-31 DIAGNOSIS — R002 Palpitations: Secondary | ICD-10-CM

## 2020-12-31 DIAGNOSIS — I341 Nonrheumatic mitral (valve) prolapse: Secondary | ICD-10-CM

## 2020-12-31 NOTE — Progress Notes (Signed)
3-day monitor registered in [employer]. Instructions and monitor provided in office today.

## 2020-12-31 NOTE — Progress Notes (Signed)
[facility] PROGRESS NOTE    [facility] OFFICE -CARDIOLOGY  [address]  [place] [address]  Dept: [phone]  Dept Fax: [phone]       Patient Name: Stephen Bishop    Date of Visit:  December 31, 2020  Date of Birth: Jul 19, 1961  AGE: 60 y.o.  Medical Record #: [id number]  Requesting Physician: Driscilla Grammes, MD      CHIEF COMPLAINT: Palpitations      HISTORY OF PRESENT ILLNESS:    He is a pleasant 60 y.o. male who presents today for palpitations.    He has a history PVCs and had an ablation at [facility] about 6 years ago.  He has seen Dr Amado Coe in the past.  He had an echo in December 2021 with an EF of 60%, With an EF of 60% mitral valve prolapse but there was no significant change from previous echo in 2017.    He has noted an increase in his palpitations particularly at night but now over the last several days its been happening in the afternoon.  He uses propranolol as needed and has had to take it several times in the last few days.  He does not drink an excessive amount of caffeine or alcohol.  He has been under a bit more stress recently.    PAST MEDICAL HISTORY: He has a past medical history of Arrhythmia (2015), Mitral valve prolapse, nonrheumatic, and Premature ventricular contraction. He has a past surgical history that includes Urethral dilation; Hernia repair; APPENDECTOMY (OPEN); Cholecystectomy; and Cardiac electrophysiology study and ablation (12/2014).    ALLERGIES:   Allergies   Allergen Reactions   . Sulfa Antibiotics Rash     Other reaction(s): Intolerance-unknown       MEDICATIONS:   Current Outpatient Medications:   .  propranolol (INDERAL) 10 MG tablet, Take 10 mg by mouth as needed, Disp: , Rfl:      FAMILY HISTORY: family history includes Atrial fibrillation in his father; Mitral valve prolapse in his father; Valve Surgery in his brother.    SOCIAL HISTORY: He reports that he has never smoked. He has never  used smokeless tobacco. He reports previous alcohol use. He reports that he does not use drugs.    PHYSICAL EXAMINATION    Visit Vitals  BP (!) 138/92 (BP Site: Left arm, Patient Position: Sitting, Cuff Size: Medium)   Pulse 66   Ht 1.727 m (5\' 8" )   Wt 73.7 kg (162 lb 6.4 oz)   BMI 24.69 kg/m       General Appearance:  A well-appearing male in no acute distress.    Skin: Warm and dry to touch,  Head: Normocephalic, normal hair pattern  Eyes: conjunctivae and lids unremarkable.  ENT:  No pallor or cyanosis.  Dentition good.   Neck: JVP normal,   Chest: Clear to auscultation bilaterally with good air movement and respiratory effort and no wheezes, rales, or rhonchi   Cardiovascular: Regular rhythm, S1 normal, S2 normal, No S3 or S4, Apical impulse not displaced. No murmurs. No gallops or rubs detected   Abdomen: Soft, nontender,  Extremities: Warm without edema. No clubbing, or cyanosis. All peripheral pulses are full and equal.   Neuro: Alert and oriented x3. No gross motor or sensory deficits noted, affect appropriate.        ECG: sinus rhythm with a single pvc    LABS:   No results found for: CBC  No results found for: BMP, AST, ALT  No results found for: LIPID  No results found for: HGBA1C, BNP, TSH       Most recent echo and nuclear study reviewed.      IMPRESSION:   [name] is a 60 y.o. male with the following problems:    1. Palpitations that have increased recently  2. PVCs with an [place] ablation  3. No known history of CAD with a 0 calcium score in 2014 and a normal nuclear study in 2015  4. Mitral valve prolapse with mild mitral regurgitation by echo December 2021 this was a stable study with a normal ejection fraction        RECOMMENDATIONS:    He is going to wear a 3-day Zio patch.  We will arrange for him to follow-up with Dr Amado Coe who has seen him in the past.                                                     Orders Placed This Encounter   Procedures   . Long-term Holter Monitor (3 Days)    . ECG 12 lead (Normal)       No orders of the defined types were placed in this encounter.      SIGNED:    Griffin Basil, NP          This note was generated by the Dragon speech recognition and may contain errors or omissions not intended by the user. Grammatical errors, random word insertions, deletions, pronoun errors, and incomplete sentences are occasional consequences of this technology due to software limitations. Not all errors are caught or corrected. If there are questions or concerns about the content of this note or information contained within the body of this dictation, they should be addressed directly with the author for clarification.

## 2021-01-07 ENCOUNTER — Encounter: Payer: Self-pay | Admitting: Emergency Medicine

## 2021-01-07 ENCOUNTER — Other Ambulatory Visit: Payer: Self-pay

## 2021-01-07 ENCOUNTER — Emergency Department
Admission: EM | Admit: 2021-01-07 | Discharge: 2021-01-07 | Disposition: A | Discharge status: Home / Self Care | Payer: 59 | Attending: Emergency Medicine | Admitting: Emergency Medicine

## 2021-01-07 ENCOUNTER — Emergency Department: Payer: 59

## 2021-01-07 ENCOUNTER — Encounter (INDEPENDENT_AMBULATORY_CARE_PROVIDER_SITE_OTHER): Payer: Self-pay | Admitting: Cardiovascular Disease

## 2021-01-07 ENCOUNTER — Encounter (INDEPENDENT_AMBULATORY_CARE_PROVIDER_SITE_OTHER): Payer: Commercial Managed Care - POS | Admitting: Cardiovascular Disease

## 2021-01-07 DIAGNOSIS — R000 Tachycardia, unspecified: Secondary | ICD-10-CM

## 2021-01-07 DIAGNOSIS — R109 Unspecified abdominal pain: Secondary | ICD-10-CM | POA: Diagnosis present

## 2021-01-07 DIAGNOSIS — N200 Calculus of kidney: Secondary | ICD-10-CM

## 2021-01-07 DIAGNOSIS — N3001 Acute cystitis with hematuria: Secondary | ICD-10-CM

## 2021-01-07 LAB — HEPATIC FUNCTION PANEL
ALT: 30 U/L (ref 0–44)
AST: 29 U/L (ref 15–41)
Albumin: 4.4 g/dL (ref 3.5–5.0)
Alkaline Phosphatase: 62 U/L (ref 38–126)
Bilirubin, Direct: <0.1 mg/dL (ref 0.0–0.2)
Total Bilirubin: 1.2 mg/dL (ref 0.3–1.2)
Total Protein: 7.5 g/dL (ref 6.5–8.1)

## 2021-01-07 LAB — BASIC METABOLIC PANEL
Anion gap: 15 (ref 5–15)
BUN: 18 mg/dL (ref 6–20)
CO2: 21 mmol/L — ABNORMAL LOW (ref 22–32)
Calcium: 9.7 mg/dL (ref 8.9–10.3)
Chloride: 104 mmol/L (ref 98–111)
Creatinine, Ser: 1.44 mg/dL — ABNORMAL HIGH (ref 0.61–1.24)
GFR, Estimated: 57 mL/min — ABNORMAL LOW (ref >60)
Glucose, Bld: 138 mg/dL — ABNORMAL HIGH (ref 70–99)
Potassium: 3.7 mmol/L (ref 3.5–5.1)
Sodium: 140 mmol/L (ref 135–145)

## 2021-01-07 LAB — CBC WITH DIFFERENTIAL/PLATELET
Abs Immature Granulocytes: 0.06 10*3/uL (ref 0.00–0.07)
Basophils Absolute: 0 10*3/uL (ref 0.0–0.1)
Basophils Relative: 0 %
Eosinophils Absolute: 0.1 10*3/uL (ref 0.0–0.5)
Eosinophils Relative: 1 %
HCT: 44.3 % (ref 39.0–52.0)
Hemoglobin: 15.7 g/dL (ref 13.0–17.0)
Immature Granulocytes: 0 %
Lymphocytes Relative: 27 %
Lymphs Abs: 4.2 10*3/uL — ABNORMAL HIGH (ref 0.7–4.0)
MCH: 29.3 pg (ref 26.0–34.0)
MCHC: 35.4 g/dL (ref 30.0–36.0)
MCV: 82.6 fL (ref 80.0–100.0)
Monocytes Absolute: 0.7 10*3/uL (ref 0.1–1.0)
Monocytes Relative: 4 %
Neutro Abs: 10.7 10*3/uL — ABNORMAL HIGH (ref 1.7–7.7)
Neutrophils Relative %: 68 %
Platelets: 225 10*3/uL (ref 150–400)
RBC: 5.36 MIL/uL (ref 4.22–5.81)
RDW: 13.5 % (ref 11.5–15.5)
WBC: 15.8 10*3/uL — ABNORMAL HIGH (ref 4.0–10.5)
nRBC: 0 % (ref 0.0–0.2)

## 2021-01-07 LAB — URINALYSIS, COMPLETE (UACMP) WITH MICROSCOPIC
Bilirubin Urine: NEGATIVE
Glucose, UA: NEGATIVE mg/dL
Ketones, ur: NEGATIVE mg/dL
Leukocytes,Ua: NEGATIVE
Nitrite: NEGATIVE
Protein, ur: 100 mg/dL — AB
RBC / HPF: >50 RBC/hpf — ABNORMAL HIGH (ref 0–5)
Specific Gravity, Urine: 1.016 (ref 1.005–1.030)
Squamous Epithelial / HPF: NONE SEEN (ref 0–5)
pH: 6 (ref 5.0–8.0)

## 2021-01-07 LAB — CK: Total CK: 204 U/L (ref 49–397)

## 2021-01-07 LAB — MAGNESIUM: Magnesium: 2.1 mg/dL (ref 1.7–2.4)

## 2021-01-07 MED ORDER — ACETAMINOPHEN 500 MG PO TABS
1000.0000 mg | ORAL_TABLET | Freq: Once | ORAL | Status: AC
  Administered 2021-01-07: 1000 mg via ORAL
  Filled 2021-01-07: qty 2

## 2021-01-07 MED ORDER — CIPROFLOXACIN HCL 500 MG PO TABS: 500.0000 mg | ORAL_TABLET | Freq: Two times a day (BID) | ORAL | 0 refills | Status: AC

## 2021-01-07 MED ORDER — ONDANSETRON HCL 4 MG/2ML IJ SOLN
4.0000 mg | Freq: Once | INTRAMUSCULAR | Status: AC
  Administered 2021-01-07: 4 mg via INTRAVENOUS
  Filled 2021-01-07: qty 2

## 2021-01-07 MED ORDER — SODIUM CHLORIDE 0.9 % IV SOLN
1.0000 g | Freq: Once | INTRAVENOUS | Status: AC
  Administered 2021-01-07: 1 g via INTRAVENOUS
  Filled 2021-01-07: qty 10

## 2021-01-07 MED ORDER — OXYCODONE-ACETAMINOPHEN 5-325 MG PO TABS: 1.0000 | ORAL_TABLET | Freq: Three times a day (TID) | ORAL | 0 refills | Status: AC | PRN

## 2021-01-07 MED ORDER — FENTANYL CITRATE (PF) 100 MCG/2ML IJ SOLN
50.0000 ug | INTRAMUSCULAR | Status: DC | PRN
  Administered 2021-01-07: 50 ug via INTRAVENOUS
  Filled 2021-01-07: qty 2

## 2021-01-07 MED ORDER — ONDANSETRON HCL 4 MG PO TABS: 4.0000 mg | ORAL_TABLET | Freq: Three times a day (TID) | ORAL | 0 refills | Status: AC | PRN

## 2021-01-07 MED ORDER — LACTATED RINGERS IV BOLUS
1000.0000 mL | Freq: Once | INTRAVENOUS | Status: AC
  Administered 2021-01-07: 1000 mL via INTRAVENOUS

## 2021-01-07 NOTE — ED Notes (Signed)
See triage note  Presents with back pain  States pain is mainly on the left flank area  Started suddenly about 1 hour PTA  Some nausea  Was given meds in triage  States pain has eased off

## 2021-01-07 NOTE — ED Triage Notes (Signed)
Pt to ED via POV c/o severe left flank pain that started about 1 hour PTA. Pt states that the pain had sudden onset, pt has vomited x 1. Pt is also having some nausea. Pt denies hx/o of Kidney stone. Pt is cool and clammy. VSS.

## 2021-01-07 NOTE — ED Provider Notes (Signed)
[facility] Emergency Department Provider Note  ____________________________________________   Event Date/Time   First MD Initiated Contact with Patient 01/07/21 1037     (approximate)  I have reviewed the triage vital signs and the nursing notes.   HISTORY  Chief Complaint Flank Pain   HPI Donald Jordan is a 60 y.o. 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  Patient states he is visiting from out of town to watch his son play some baseball.  States he has never had a prior similar episode and has no history of stones.  States he had an episode of nonbloody nonbilious emesis prior to arrival that he received some pain and nausea medicine in triage which vastly improved his pain and his nausea.  Dates his pain was coming and going and was not constant and he currently has 0/10 pain.  He denies any abdominal pain or right-sided pain.  He has a urine has been quite dark today but he has not had any burning with urination but not sure if there is blood in it.  He has not had any diarrhea, chest pain, cough, shortness of breath, fevers, chills, headache or earache, sore throat, muscle aches, rash or recent injuries or falls.  Denies any other acute concerns at this time.         History reviewed. No pertinent past medical history.  There are no problems to display for this patient.     Prior to Admission medications   Medication Sig Start Date End Date Taking? Authorizing Provider  ciprofloxacin (CIPRO) 500 MG tablet Take 1 tablet (500 mg total) by mouth 2 (two) times daily for 10 days. 01/07/21 01/17/21 Yes Gilles Chiquito, MD  ondansetron (ZOFRAN) 4 MG tablet Take 1 tablet (4 mg total) by mouth every 8 (eight) hours as needed for up to 10 doses for nausea or vomiting. 01/07/21   Yes Gilles Chiquito, MD  oxyCODONE-acetaminophen (PERCOCET) 5-325 MG tablet Take 1 tablet by mouth every 8 (eight) hours as needed for up to 5 days for severe pain. 01/07/21 01/12/21 Yes Gilles Chiquito, MD  propranolol ER (INDERAL LA) 60 MG 24 hr capsule Take 60 mg by mouth daily.   Yes [provider]    Allergies Sulfa antibiotics  No family history on file.  Social History    Review of Systems  Review of Systems  Constitutional:  Negative for chills and fever.  HENT:  Negative for sore throat.   Eyes:  Negative for pain.  Respiratory:  Negative for cough and stridor.   Cardiovascular:  Negative for chest pain.  Gastrointestinal:  Positive for nausea and vomiting.  Genitourinary:  Positive for flank pain.  Musculoskeletal:  Negative for myalgias.  Skin:  Negative for rash.  Neurological:  Negative for seizures, loss of consciousness and headaches.  Psychiatric/Behavioral:  Negative for suicidal ideas.   All other systems reviewed and are negative.    ____________________________________________   PHYSICAL EXAM:  VITAL SIGNS: ED Triage Vitals  Enc Vitals Group     BP 01/07/21 0802 137/88     Pulse Rate 01/07/21 0802 75     Resp 01/07/21 0802 (!) 22     Temp 01/07/21 0802 97.9 F (36.6 C)     Temp Source 01/07/21 0802 Oral     SpO2  01/07/21 0802 100 %     Weight 01/07/21 0803 160 lb (72.6 kg)     Height 01/07/21 0803 5\' 8"  (1.727 m)     Head Circumference --      Peak Flow --      Pain Score 01/07/21 0802 10     Pain Loc --      Pain Edu? --      Excl. in GC? --    Vitals:   01/07/21 0802 01/07/21 1223  BP: 137/88 125/77  Pulse: 75 (!) 57  Resp: (!) 22 16  Temp: 97.9 F (36.6 C) 97.9 F (36.6 C)  SpO2: 100% 97%   Physical Exam Vitals and nursing note reviewed.  Constitutional:      Appearance: He is well-developed.  HENT:     Head: Normocephalic and atraumatic.     Right Ear: External ear normal.     Left Ear: External ear normal.      Nose: Nose normal.  Eyes:     Conjunctiva/sclera: Conjunctivae normal.  Cardiovascular:     Rate and Rhythm: Normal rate and regular rhythm.     Heart sounds: No murmur heard. Pulmonary:     Effort: Pulmonary effort is normal. No respiratory distress.     Breath sounds: Normal breath sounds.  Abdominal:     Palpations: Abdomen is soft.     Tenderness: There is no abdominal tenderness. There is left CVA tenderness. There is no right CVA tenderness.  Musculoskeletal:     Cervical back: Neck supple.  Skin:    General: Skin is warm and dry.     Capillary Refill: Capillary refill takes less than 2 seconds.  Neurological:     Mental Status: He is alert and oriented to person, place, and time.  Psychiatric:        Mood and Affect: Mood normal.     ____________________________________________   LABS (all labs ordered are listed, but only abnormal results are displayed)  Labs Reviewed  CBC WITH DIFFERENTIAL/PLATELET - Abnormal; Notable for the following components:      Result Value   WBC 15.8 (*)    Neutro Abs 10.7 (*)    Lymphs Abs 4.2 (*)    All other components within normal limits  BASIC METABOLIC PANEL - Abnormal; Notable for the following components:   CO2 21 (*)    Glucose, Bld 138 (*)    Creatinine, Ser 1.44 (*)    GFR, Estimated 57 (*)    All other components within normal limits  URINALYSIS, COMPLETE (UACMP) WITH MICROSCOPIC - Abnormal; Notable for the following components:   Color, Urine YELLOW (*)    APPearance CLOUDY (*)    Hgb urine dipstick LARGE (*)    Protein, ur 100 (*)    RBC / HPF >50 (*)    Bacteria, UA RARE (*)    All other components within normal limits  URINE CULTURE  HEPATIC FUNCTION PANEL  MAGNESIUM  CK   ____________________________________________  EKG  ____________________________________________  RADIOLOGY  ED MD interpretation: CT A/P Left 5 mm UPJ with mild hydro without perinephric stranding.  Aortic atherosclerosis and  diverticulosis without evidence of diverticulitis.  No other clear acute abdominopelvic pathology.  Official radiology report(s): CT Renal Stone Study  Result Date: 01/07/2021 CLINICAL DATA:  Left flank pain, hematuria EXAM: CT ABDOMEN AND PELVIS WITHOUT CONTRAST TECHNIQUE: Multidetector CT imaging of the abdomen and pelvis was performed following the standard protocol without IV contrast. COMPARISON:  None. FINDINGS: Lower  chest: No pleural or pericardial effusion.  Lung bases clear. Hepatobiliary: No focal liver abnormality is seen. Status post cholecystectomy. No biliary dilatation. Pancreas: Unremarkable. No pancreatic ductal dilatation or surrounding inflammatory changes. Spleen: Normal in size without focal abnormality. Adrenals/Urinary Tract: Adrenal glands unremarkable. Right kidney normal. Mild left hydronephrosis with 5 mm calculus near the UPJ. Urinary bladder incompletely distended. Stomach/Bowel: Stomach and small bowel decompressed. Appendix surgically absent by report. The colon is nondilated with scattered descending and sigmoid diverticula; no adjacent inflammatory change. Vascular/Lymphatic: No abdominal or pelvic adenopathy. Aortic Atherosclerosis (ICD10-170.0) without aneurysm. Reproductive: Prostate is unremarkable. Other: No ascites.  No free air. Musculoskeletal: Benign L3 vertebral body hemangioma. No fracture or worrisome bone lesion. IMPRESSION: 1. 29mm left UPJ calculus with mild hydronephrosis 2.  Aortic Atherosclerosis (ICD10-170.0). 3. Colonic diverticulosis Electronically Signed   By: Corlis Leak M.D.   On: 01/07/2021 11:35    ____________________________________________   PROCEDURES  Procedure(s) performed (including Critical Care):  Procedures   ____________________________________________   INITIAL IMPRESSION / ASSESSMENT AND PLAN / ED COURSE      Patient presents with above-stated history exam for assessment of some nontraumatic left-sided flank pain and left  lower back pain associate with nonbloody nonbilious emesis that started today associate with some dark urine.  On arrival he is afebrile hemodynamically stable.  He did receive some fentanyl and Zofran in triage and on my assessment states he is currently pain and nausea free.  Primary differential includes kidney stone, diverticulitis, muscle cramps, pyonephritis, possible cystitis.  CBC with WBC count of 15.8, normal hemoglobin and normal platelets.  MP with a creatinine of 1.44 compared to 1.3 September 21.  Follow-up significant lecture to metabolic derangements.  Hepatic function panel unremarkable.  Magnesium CK unremarkable.  UA with large hemoglobin, 100 protein, greater than 50 RBCs and 6-10 WBCs with rare bacteria.  No leukocyte esterase or nitrites.  CT A/P Left 5 mm UPJ with mild hydro without perinephric stranding.  Aortic atherosclerosis and diverticulosis without evidence of diverticulitis.  No other clear acute abdominopelvic pathology.  Impression is likely symptoms related to left UVJ stone.  No significant change in kidney function and on my assessment after patient received fentanyl and Zofran in triage patient denies any pain or nausea.  He is tolerating p.o.  Somewhat difficult to exclude infection given WBCs or bacteria on UA.  Above did not believe patient is septic at this time.  Will obtain urine culture and give patient dose of Rocephin.  Patient states he has a urologist and was able to get appointment for tomorrow morning.  States he prefers doing this instead of staying here to try to see a neurologist.  Given stable vitals with him tolerating p.o. and pain and nausea under control I think this is reasonable.  Given I can exclude infection will write course of Cipro as well as analgesia and antiemetics.  Patient discharged stable condition.  Strict precautions advised and discussed.   ____________________________________________   FINAL CLINICAL IMPRESSION(S) / ED  DIAGNOSES  Final diagnoses:  Kidney stone  Acute cystitis with hematuria    Medications  fentaNYL (SUBLIMAZE) injection 50 mcg (50 mcg Intravenous Given 01/07/21 0811)  ondansetron (ZOFRAN) injection 4 mg (4 mg Intravenous Given 01/07/21 0811)  lactated ringers bolus 1,000 mL (0 mLs Intravenous Stopped 01/07/21 1309)  acetaminophen (TYLENOL) tablet 1,000 mg (1,000 mg Oral Given 01/07/21 1114)  cefTRIAXone (ROCEPHIN) 1 g in sodium chloride 0.9 % 100 mL IVPB (0 g Intravenous Stopped 01/07/21 1307)  ED Discharge Orders          Ordered    ciprofloxacin (CIPRO) 500 MG tablet  2 times daily        01/07/21 1255    ondansetron (ZOFRAN) 4 MG tablet  Every 8 hours PRN        01/07/21 1255    oxyCODONE-acetaminophen (PERCOCET) 5-325 MG tablet  Every 8 hours PRN        01/07/21 1255             Note:  This document was prepared using Dragon voice recognition software and may include unintentional dictation errors.    Gilles ChiquitoSmith, Montarius Kitagawa P, MD 01/07/21 [phone]

## 2021-01-07 NOTE — Progress Notes (Signed)
CARDIAC AMBULATORY MONITOR REPORT    HRT [facility] OFFICE -CARDIOLOGY  [address]  [place] [address]  Dept: [phone]  Dept Fax: [phone]    NAME:  Stephen Bishop   SEX: Male   DOB:  01-Dec-1960 (60 y.o.)  MRN:  [id number]          STUDY DATE: June 8 through January 03, 2021  INTERPRETATION DATE: 01/07/2021     ORDERING PHYSICIAN:  McMackin, Stephanie Coup, MD          INDICATION:   1. Palpitations, PVCs    AMBULATORY MONITOR DETAILS:    Ambulatory monitor type: Zio XT Continuous Ambulatory Monitoring  Enrollment period:   3 days 0 hrs      SUMMARY OF CLINICAL FINDINGS    Normal sinus rhythm throughout the recording with an average heart rate of 77 bpm and a heart rate range between 48 and 152 bpm.  No atrial fibrillation.  No sustained arrhythmias.  No significant pauses.  Frequent PVCs accounting for 5.2% of all beats.  40 patient triggers.  These correlate to sinus rhythm with periods of ventricular bigeminy, ventricular trigeminy, isolated PVCs and isolated PACs.    CONCLUSION:  1. Normal sinus rhythm with frequent PVCs accounting for 5.2% of all beats  2. Symptomatic transmissions correlate to sinus rhythm with periods of ventricular bigeminy, ventricular trigeminy, isolated PVCs and isolated PACs    [name] will be notified with test results.  He is scheduled to follow-up with Dr. Amado Coe in the near future.      The tracings for the above mentioned monitor can be requested by calling [facility] at [phone], ext. [phone].      Interpreted by : Sandria Senter, MD

## 2021-01-08 LAB — URINE CULTURE: Culture: NO GROWTH

## 2021-02-10 ENCOUNTER — Ambulatory Visit (INDEPENDENT_AMBULATORY_CARE_PROVIDER_SITE_OTHER): Payer: Commercial Managed Care - POS | Admitting: Cardiovascular Disease

## 2021-02-10 ENCOUNTER — Encounter (INDEPENDENT_AMBULATORY_CARE_PROVIDER_SITE_OTHER): Payer: Self-pay | Admitting: Cardiovascular Disease

## 2021-02-10 VITALS — BP 112/65 | HR 72 | Ht 68.0 in | Wt 164.8 lb

## 2021-02-10 DIAGNOSIS — R002 Palpitations: Secondary | ICD-10-CM

## 2021-02-10 DIAGNOSIS — I493 Ventricular premature depolarization: Secondary | ICD-10-CM

## 2021-02-10 NOTE — Progress Notes (Addendum)
[facility] ELECTROPHYSI[facility] OFFICE CONSULTATION NOTE    [facility]RT [facility] OFFICE      [facility] OFFICE [facility]  [address]  [place] [address]  Dept: [phone]  Dept Fax: [phone]         Patient Name: Stephen Bishop, Stephen Bishop    Date of Visit:  February 10, 2021  Date of Birth: 12-Oct-1960  AGE: 60 y.o.  Medical Record #: [id number]    Primary Electrophysiologist: Jarvis Morgan. Amado Coe MD    Referring Cardiologist: Michell Heinrich MD, [facility]      CHIEF COMPLAINT:  Palpitations (E/P Consult )      HISTORY OF PRESENT ILLNESS    [name] is being seen today for Electrophysiology evaluation at the request of Michell Heinrich MD, [facility] He is a pleasant 60 y.o. male who presents for electrophysiology follow up for symptomatic PVCs.    The patient has a history of mitral valve prolapse with an accelerating burden of symptomatic PVCs, with a 17% burden on Holter monitor. He was seen by two electrophysiologists in the [facility] and ultimately was self referred to [facility] where he was taken to the EP lab by Dr. Nena Polio. PVCs were mapped to the superolateral aspect of the mitral valve ring. An ablation was performed at the site as well as reinforcing lesions  on the overlying coronary sinus. He noted dramatic improvement of symptoms following the ablation.     He would be seen in electrophysiology clinic (Dr. Marlou Starks) in November 2018 as a follow up  for increasing nocturnal palpitations with a Holter monitor showing a 4.94% PVC burden. He would subsequently be on and off propranolol and initially his symptoms were improved by Propranolol, however over the years Propranolol became less effective. His echocardiogram in 06/2020 demonstrated ongoing preserved LV function.     He was seen in cardiology clinic most recently on 12/31/2020 for increase in his palpitations particularly at night but also occurring during the day time in the preceding days despite taking  propranolol 10 mg as needed. At the time recommended for a 3 day Zio-patch which demonstrated frequent PVCs accounting for 5.2% of all beats. 40 patient triggers. These correlated to sinus rhythm with periods of ventricular bigeminy, ventricular trigeminy, isolated PVCs and isolated PACs.    Presently (02/10/2021), he  is seen for follow up of results and recommendations regarding further therapies. At the moment, states that his episodic palpitations are occurring 3-4 times per week and mostly in the middle of the night lasting seconds. He seldomly gets them during the day and take propranolol when his palpitations gets severe. All other review  of systems is negative.    PAST MEDICAL HISTORY: He has a past medical history of Arrhythmia (2015), Mitral valve prolapse, nonrheumatic, and Premature ventricular contraction. He has a past surgical history that includes Urethral dilation; Hernia repair; APPENDECTOMY (OPEN); Cholecystectomy; and Cardiac electrophysiology study and ablation (12/2014).    ALLERGIES:   Allergies   Allergen Reactions    Sulfa Antibiotics Rash     Other reaction(s): Intolerance-unknown       MEDICATIONS:   Current Outpatient Medications:     propranolol (INDERAL) 10 MG tablet, Take 10 mg by mouth as needed, Disp: , Rfl:    Patient's current medications were reviewed. ONLY Cardiac medications were updated unless others were addressed in assessment and plan.    FAMILY HISTORY: family history includes Atrial fibrillation in his father; Mitral valve prolapse in  his father; Valve Surgery in his brother.    SOCIAL HISTORY: He reports that he has never smoked. He has never used smokeless tobacco. He reports that he does not currently use alcohol. He reports that he does not use drugs.    REVIEW OF SYSTEMS:    Comprehensive review of systems including constitutional, eyes, ears, nose, mouth, throat, cardiovascular, GI, GU, musculoskeletal, integumentary, respiratory, neurologic, psychiatric, and  endocrine is negative other than what is mentioned already in the history of present illness    PHYSICAL EXAMINATION  Visit Vitals  BP 112/65 (BP Site: Left arm, Patient Position: Sitting, Cuff Size: Large)   Pulse 72   Ht 1.727 m (5\' 8" )   Wt 74.8 kg (164 lb 12.8 oz)   BMI 25.06 kg/m     General Appearance:  A well-appearing male in no acute distress.    Skin: Warm and dry to touch, no apparent skin lesions, or masses noted.  Head: Normocephalic, normal hair pattern.  Eyes: EOMS Intact, PERRL, conjunctivae and lids unremarkable.    Neck: JVP normal, no carotid bruit, thyroid not enlarged  Chest: Clear to auscultation bilaterally with good air movement and respiratory effort and no wheezes, rales, or rhonchi   Cardiovascular: Regularly irregular rhythm with skipped beats, S1 normal, S2 normal, No S3 or S4. Apical impulse not displaced,. No murmur. No gallops or rubs detected   Abdomen: Soft, nontender, nondistended, with normoactive bowel sounds. No organomegaly.  No pulsatile masses, or bruits.   Extremities: bilateral radial pulses 2+  Neuro: Alert and oriented x3.   Psych: mood and affect appropriate.    ECG (01/23/2021): NSR with occasional ectopic PVC (upright in the inferior leads)     CARDIAC AMBULATORY MONITOR REPORT    STUDY DATE: June 8 through January 03, 2021  INTERPRETATION DATE: 01/07/2021     AMBULATORY MONITOR DETAILS:     Ambulatory monitor type:        Zio XT Continuous Ambulatory Monitoring  Enrollment period:                   3 days 0 hrs     SUMMARY OF CLINICAL FINDINGS     Normal sinus rhythm throughout the recording with an average heart rate of 77 bpm and a heart rate range between 48 and 152 bpm.  No atrial fibrillation.  No sustained arrhythmias.  No significant pauses.  Frequent PVCs accounting for 5.2% of all beats.  40 patient triggers.  These correlate to sinus rhythm with periods of ventricular bigeminy, ventricular trigeminy, isolated PVCs and isolated PACs.    LABS:   No results found  for: WBC, HGB, HCT, PLT  No results found for: GLU, BUN, CREAT, NA, K, CL, CO2, CA, PROT, AST, ALT, BILITOTAL, GLOB, ALB  No results found for: MG, TSH, HGBA1C, BNP  No results found for: CREATCL    IMPRESSION:   Mr. Vanetten is a 60 y.o. 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  History of mitral valve prolapse with moderate mitral regurgitation.    RECOMMENDATIONS:    Presently (02/10/2021), we had a long discussion regarding the patient's PVC burden of 5% not being of a high frequency to be able to map during an  ablative procedure and certainly not enough to cause a reduction in heart function. As such, it comes down to quality of life and symptomatic relief. We discussed anti-arrhythmic medications including Flecainide as a potential medication to help alleviate symptoms, however the patient is a bit cautious and would like to think this over. For now we recommend if he would like, to increase the dose of propranolol from 10 to 20 mg PRN for palpitations. Will have patient follow up in 6 months to determine if his quality of life is being significantly impacted by his PVCs and whether he is amenable for alternate therapies.                                               Orders Placed This Encounter   Procedures    ECG 12 lead (Normal)       No orders of the defined types were placed in this encounter.        SIGNED:    Viann Shove, MD    Attestation:  The patient has been seen and examined. VS reviewed. My Exam as below.  Otherwise agree with EP APP note and plan with changes in italics.    My assessment and plan as detailed below:    The patient is a 60 year old male with a history of mitral valve  prolapse with associated regurgitation.  It symptomatic ventricular ectopy and underwent an ablation of a PVC at [facility] mapped to the superior lateral mitral valve annulus.  I saw patient in 2018 with symptomatic PVCs with an EKG showing a right bundle superior axis morphology.  His symptoms did abate.  Over the last month he has noted increasing palpitations with a recent ambulatory monitor showing a 5% burden.  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  We discussed management considerations and to address symptoms and quality of life including antiarrhythmic drug therapy as well as ablation.  Given the pleomorphic nature of observed PVCs I be more inclined to consider low-dose flecainide therapy.  He was hesitant to proceed with antiarrhythmic drug therapy and may play around with increased dose of propanolol.  He will call in if symptoms worsen or if he ultimately decides to proceed with a augmented therapeutic approach.  We will plan for routine check in 6 months time.    Physical Exam  Constitutional: Cooperative, alert, well-developed, well-nourished, in no acute distress  Cardiovascular: regular rate and rhythm, normal S1, S2, no S3, no S4, no murmurs, rubs or gallops  Neck: no carotid bruits, JVP normal  Lungs: clear to auscultation bilaterally, without wheezing, rhonchi, or rales  Abdomen: soft, non-tender  Extremities: no edema  Neurological:  alert and oriented.  Skin: No rash    Signed by:  Thurnell Lose, MD    Jarvis Morgan fine MD      This note was generated by the Dragon speech recognition and may contain errors or omissions not intended by the user. Grammatical errors, random word insertions, deletions, pronoun errors, and incomplete sentences are occasional consequences of this technology due to software limitations. Not all errors are caught or corrected. If there are questions or concerns about the content of this note or information  contained within the body of this dictation, they should be addressed directly with the author for clarification.

## 2021-05-25 ENCOUNTER — Telehealth (INDEPENDENT_AMBULATORY_CARE_PROVIDER_SITE_OTHER): Payer: Self-pay

## 2021-05-25 NOTE — Telephone Encounter (Signed)
LVM on patient's number that per Dr. Donnie Coffin he does not need anther echo at this time.  Last year's result was overall fine.

## 2021-05-25 NOTE — Telephone Encounter (Signed)
Patient states he typically gets an annual echo for MVP prior to seeing Dr. Donnie Coffin which is done in the December-January time frame.  Informed him I would touch base with Dr. Donnie Coffin and call back.

## 2021-05-29 ENCOUNTER — Encounter (INDEPENDENT_AMBULATORY_CARE_PROVIDER_SITE_OTHER): Payer: Self-pay

## 2021-06-01 ENCOUNTER — Other Ambulatory Visit (INDEPENDENT_AMBULATORY_CARE_PROVIDER_SITE_OTHER): Payer: Self-pay

## 2021-06-01 MED ORDER — PROPRANOLOL HCL 10 MG PO TABS
10.0000 mg | ORAL_TABLET | ORAL | 3 refills | Status: DC | PRN
Start: 2021-06-01 — End: 2022-07-22

## 2021-06-01 NOTE — Telephone Encounter (Signed)
Pt called in for PRN propanolol  and sent to requested pharmacy

## 2022-05-10 IMAGING — CT CT RENAL STONE PROTOCOL
2 of 4 series · 16 of 46 positions shown, 18 images · non-contrast
Comparison: None.

CLINICAL DATA: Left flank pain, hematuria

EXAM:
CT ABDOMEN AND PELVIS WITHOUT CONTRAST
TECHNIQUE: Multidetector CT imaging of the abdomen and pelvis was performed
following the standard protocol without IV contrast.

[Series 2: stone full standard · axial · 0.70mm/px · z∈[-1122,-696]mm · 13 of 95 slices shown, 15 images]
[im 5/95  soft-tissue]
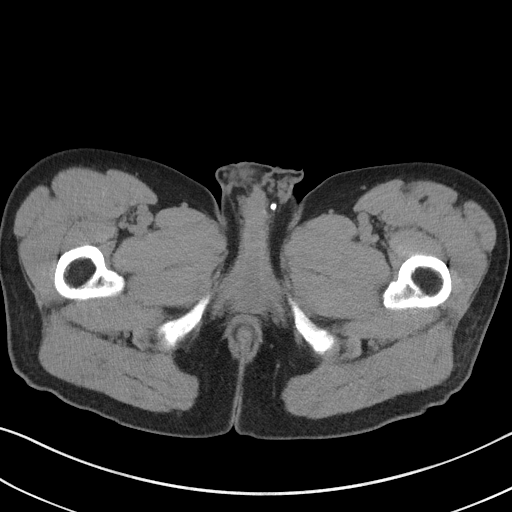
[im 5/95  bone]
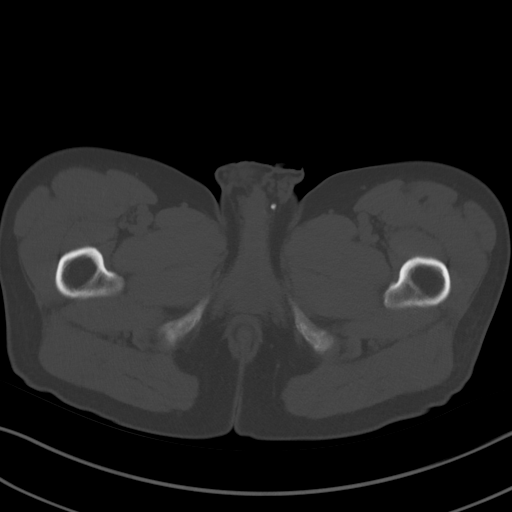
[im 13/95  soft-tissue]
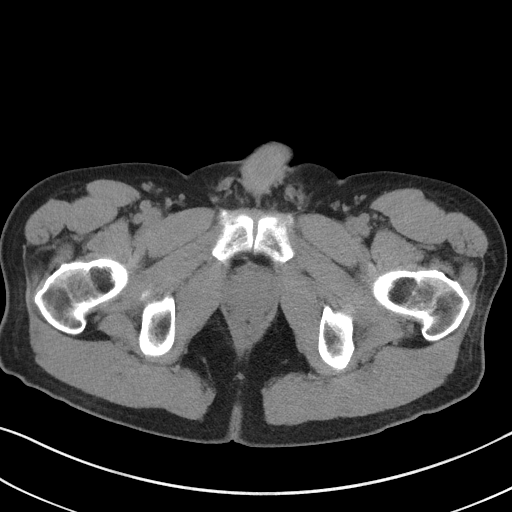
[im 21/95  soft-tissue]
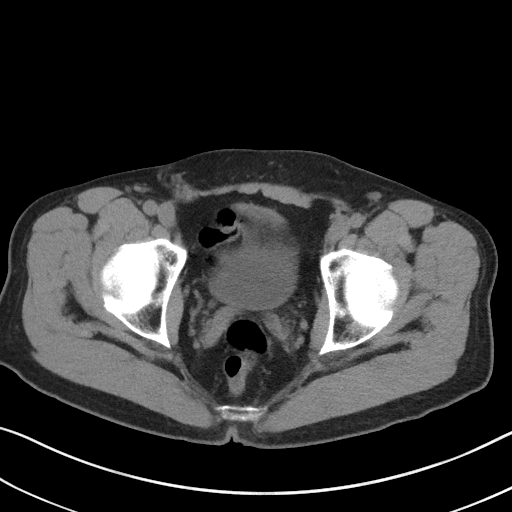
[im 25/95  soft-tissue]
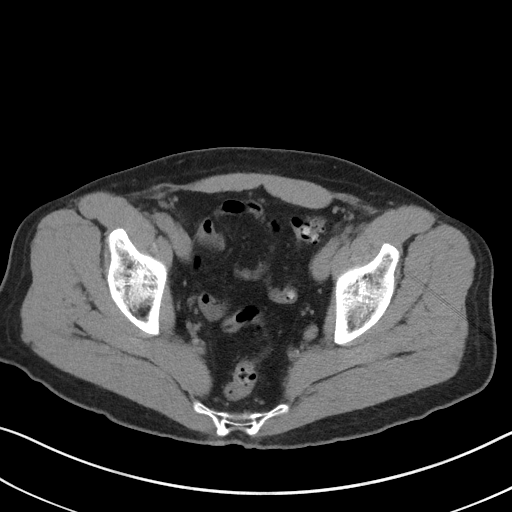
[im 33/95  soft-tissue]
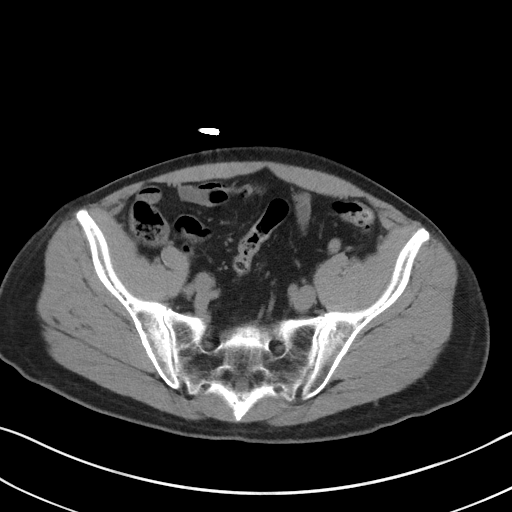
[im 41/95  soft-tissue]
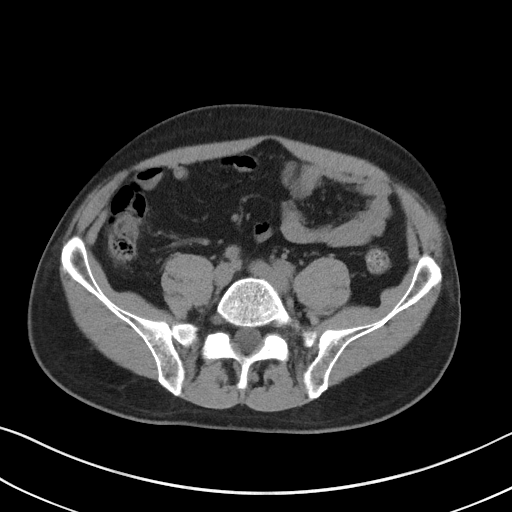
[im 50/95  soft-tissue]
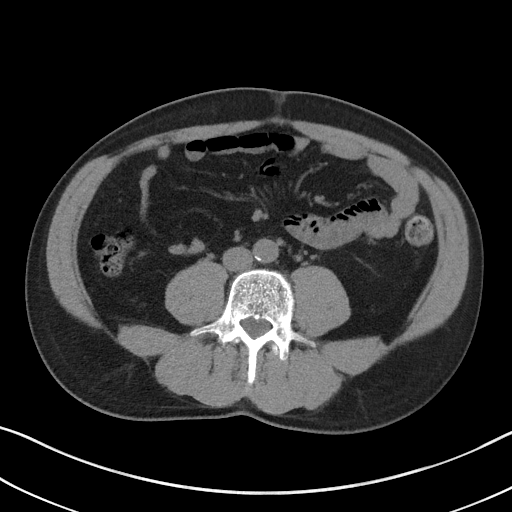
[im 54/95  soft-tissue]
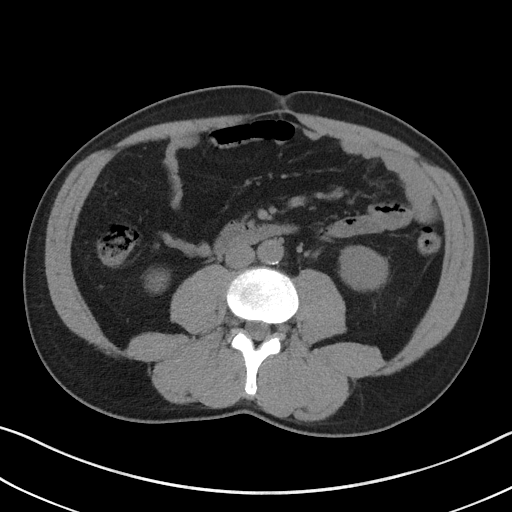
[im 62/95  soft-tissue]
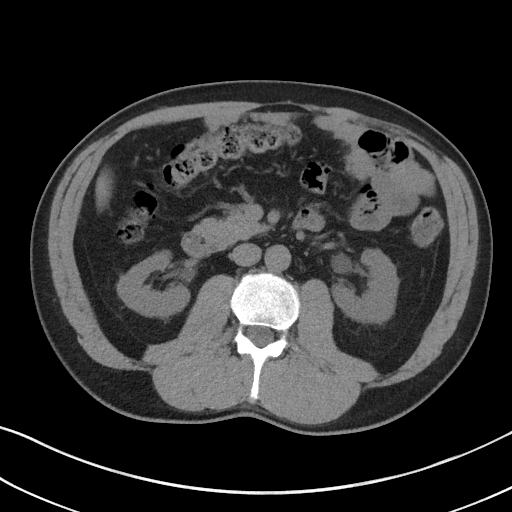
[im 62/95  bone]
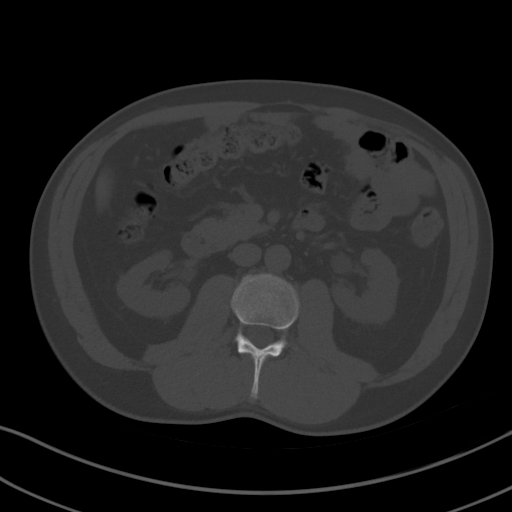
[im 70/95  soft-tissue]
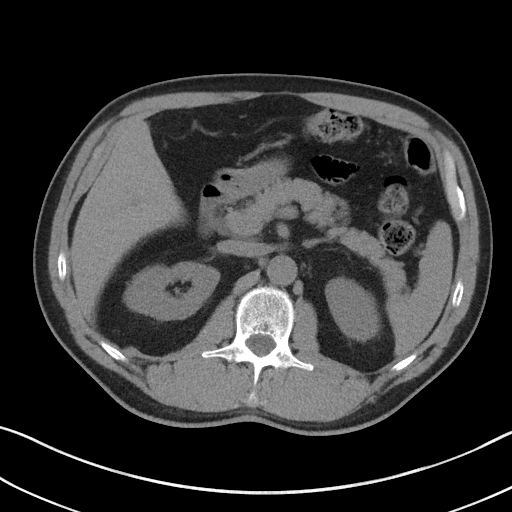
[im 74/95  soft-tissue]
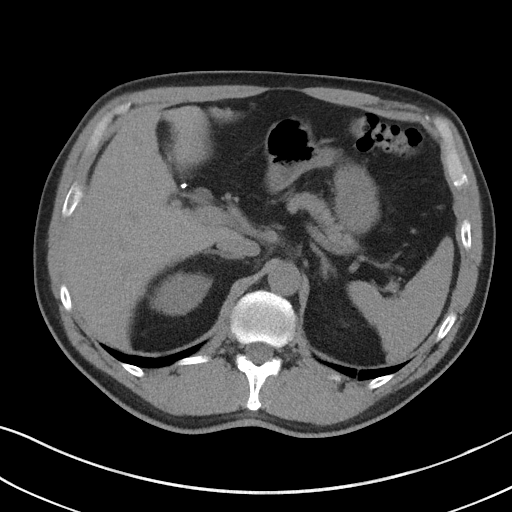
[im 82/95  soft-tissue]
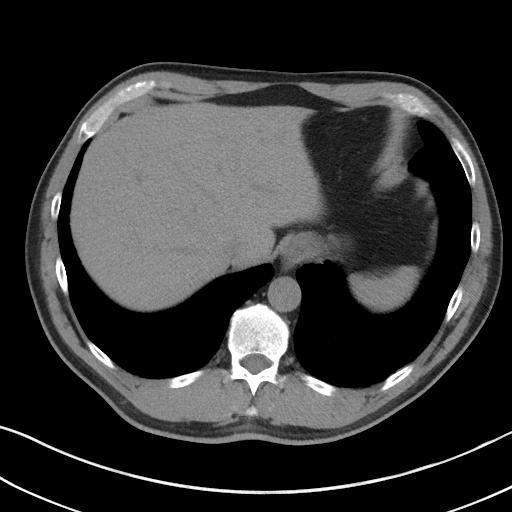
[im 90/95  soft-tissue]
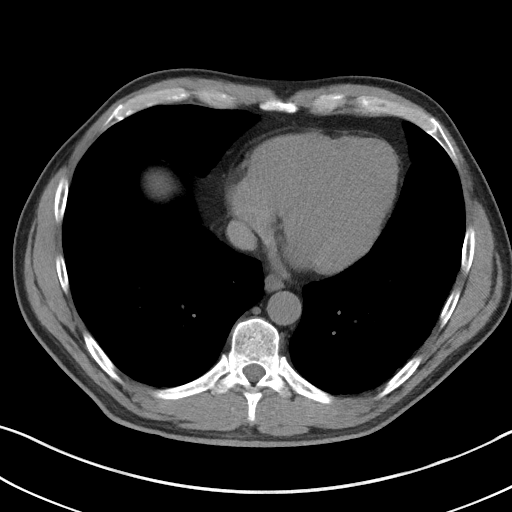

[Series 5: coronal · coronal · 0.72mm/px · 3 of 135 slices shown]
[im 45/135  soft-tissue]
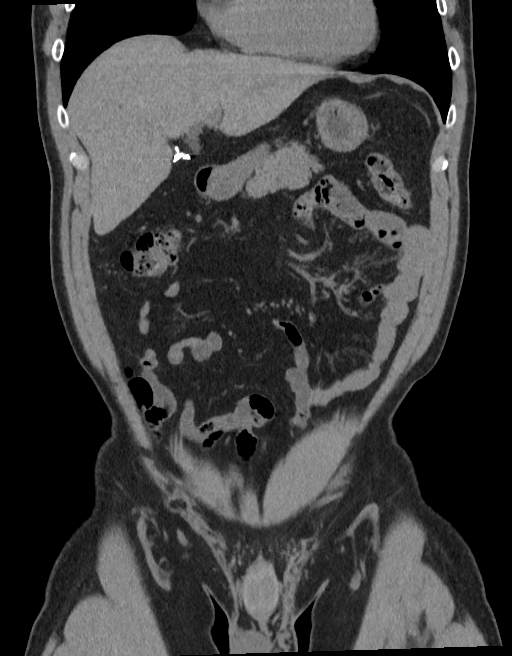
[im 60/135  soft-tissue]
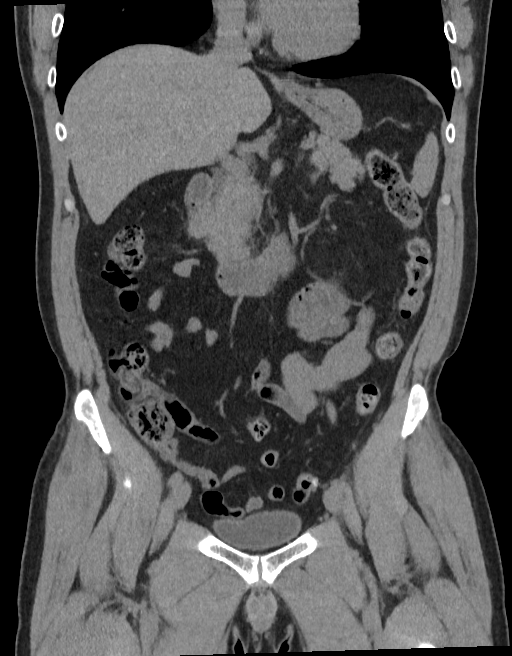
[im 75/135  soft-tissue]
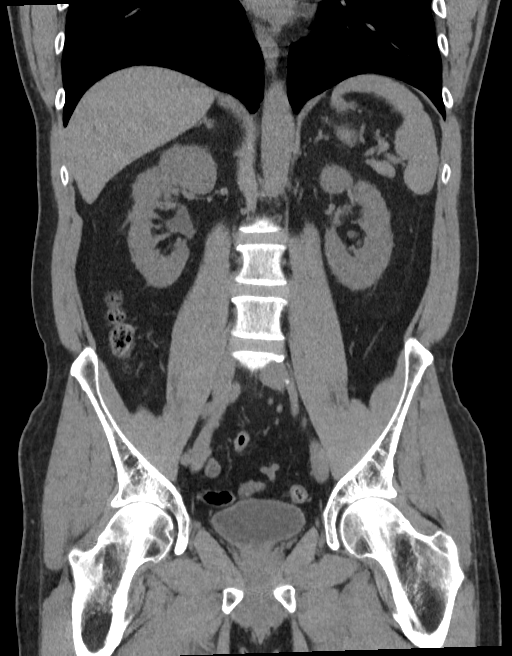

[16 of 46 positions shown; findings below may reference images not displayed]

FINDINGS: Lower chest: No pleural or pericardial effusion.  Lung bases clear.

Hepatobiliary: No focal liver abnormality is seen. Status post
cholecystectomy. No biliary dilatation.

Pancreas: Unremarkable. No pancreatic ductal dilatation or
surrounding inflammatory changes.

Spleen: Normal in size without focal abnormality.

Adrenals/Urinary Tract: Adrenal glands unremarkable. Right kidney
normal. Mild left hydronephrosis with 5 mm calculus near the UPJ.
Urinary bladder incompletely distended.

Stomach/Bowel: Stomach and small bowel decompressed. Appendix
surgically absent by report. The colon is nondilated with scattered
descending and sigmoid diverticula; no adjacent inflammatory change.

Vascular/Lymphatic: No abdominal or pelvic adenopathy. Aortic
Atherosclerosis (NLREV-170.0) without aneurysm.

Reproductive: Prostate is unremarkable.

Other: No ascites.  No free air.

Musculoskeletal: Benign L3 vertebral body hemangioma. No fracture or
worrisome bone lesion.
IMPRESSION: 1. 5mm left UPJ calculus with mild hydronephrosis
2.  Aortic Atherosclerosis (NLREV-170.0).
3. Colonic diverticulosis

## 2022-05-20 ENCOUNTER — Encounter (INDEPENDENT_AMBULATORY_CARE_PROVIDER_SITE_OTHER): Payer: Self-pay

## 2022-07-08 NOTE — Progress Notes (Signed)
[facility] CARDIOLOGY OFFICE PROGRESS NOTE    [facility] HEART [place] OFFICE -CARDIOLOGY  [address]  [place] [address]  Dept: [phone]  Dept Fax: [phone]       Patient Name: Stephen Bishop, Stephen Bishop    Date of Visit:  July 22, 2022  Date of Birth: 01-13-1961  AGE: 61 y.o.  Medical Record #: [id number]  Requesting Physician: McMackin, Stephanie Coup, MD      CHIEF COMPLAINT:   Chief Complaint   Patient presents with    Follow-up    Palpitations    Tachycardia        HISTORY OF PRESENT ILLNESS:    He is a 61 y.o. male who presents today for a followup visit.  He has a long history of palpitations, PVCs with prior ablation.  He was seen by Dr. Amado Coe many years ago more recently has been seen at [facility].  Palpitations most notable in the setting of personal and work-related stress.  He had been doing well though recently in the setting of health issue noted increase in palpitations which she reports occurs exclusively at night.  No anginal symptoms.  Otherwise feeling well    PAST MEDICAL HISTORY: He has a past medical history of Arrhythmia (2015), Mitral valve prolapse, nonrheumatic, and Premature ventricular contraction. He has a past surgical history that includes Urethral dilation; Hernia repair; APPENDECTOMY (OPEN); Cholecystectomy; and Cardiac electrophysiology study and ablation (12/2014).    ALLERGIES:   Allergies   Allergen Reactions    Sulfa Antibiotics Rash     Other reaction(s): Intolerance-unknown       MEDICATIONS:   Current Outpatient Medications:     metoprolol tartrate (LOPRESSOR) 25 MG tablet, Take 1 tablet (25 mg) by mouth nightly as needed (Palpitations), Disp: 60 tablet, Rfl: 3       SOCIAL HISTORY: He reports that he has never smoked. He has never used smokeless tobacco. He reports current alcohol use of about 2.0 standard drinks of alcohol per week. He reports that he does not use drugs.    PHYSICAL EXAMINATION    Visit Vitals  BP  120/84 (BP Site: Left arm, Patient Position: Sitting, Cuff Size: Large)   Pulse 79   Ht 1.727 m (5\' 8" )   Wt 77.1 kg (170 lb)   BMI 25.85 kg/m     Last 3 Weights for the past 72 hrs (Last 3 readings):   Weight   07/22/22 0801 77.1 kg (170 lb)          Constitutional: Cooperative, alert, no acute distress.  Neck: No carotid bruits, JVP normal.  Cardiac: Regular rate and rhythm, normal S1 and S2; no S3 or S4, no murmurs, no rubs, no gallops.  Pulmonary: Clear to auscultation bilaterally, no wheezing, no rhonchi, no rales.  Extremities: no edema.  Vascular: +2 pulses in radial artery bilaterally, 2+ pedal pulses bilaterally.    ECG: Normal sinus rhythm.  79    Labs:     November 2023 hemoglobin 15.7 platelets 260 BUN 15 creatinine 1.2 potassium 4.4 AST and ALT normal    Assessment:   Office follow-up.  Also followed at [facility] during periods of stress.  Noted over the past month exclusively while in bed.  Otherwise feeling well    1.   PVCs, highly symptomatic status post ablation at [facility] June 2016.     Documentation of PVCs and nonsustained ventricular tachycardia (up to five beats)  with right bundle/inferior axis morphology consistent with a left cusp versus aortomitral    continuity origin.  Intolerant of Bystolic, metoprolol, and Rythmol due to side effects.    A extended rhythm monitor performed through [facility] March 2023 noted 6% PVCs-Hopkinson suggested the use of flecainide  2.   Cardiac testing/ischemic evaluation         a.   Coronary calcium scan 2014, total score zero.         b.   Normal stress nuclear study performed with another cardiologist in      [facility] January 2015 with normal EF.         c.   Coronary calcium scan December 2022-total calcium score 9  3.   Mitral valve prolapse with MR.      a. Echocardiogram January 2015, bi-leaflet mitral valve      prolapse with moderate MR.  EF 60%.  Normal left atrial size.  Normal LV  size.    b Echo 2016 EF - Normal. Bileaflet MVP. Mild to moderate MR.    c Echo 2017 EF 65% Moderate MR    d Echo 2018 EF 60% Mild to moderate MR    e Echo 2020 EF 60%. Mitral valve is minimally thickened      though reports no mitral valve prolapse.  Mild mitral regurgitation.    F Echocardiogram December 2021 EF normal.  Borderline prolapse.  Mild MR        Plan:     Options discussed.  Will prescribe metoprolol tartrate 25 mg (1 to 2 tablets) to use nightly as needed for palpitations.  Continue routine follow-up at [facility]    He is a personal friend.  Please let me know if any issues/questions arise.    Signed by: Sandria Senter, MD                                                           Orders Placed This Encounter   Procedures    ECG 12 lead (Normal)       Orders Placed This Encounter   Medications    metoprolol tartrate (LOPRESSOR) 25 MG tablet     Sig: Take 1 tablet (25 mg) by mouth nightly as needed (Palpitations)     Dispense:  60 tablet     Refill:  3       SIGNED:    Sandria Senter, MD          This note was generated by the Dragon speech recognition and may contain errors or omissions not intended by the user. Grammatical errors, random word insertions, deletions, pronoun errors, and incomplete sentences are occasional consequences of this technology due to software limitations. Not all errors are caught or corrected. If there are questions or concerns about the content of this note or information contained within the body of this dictation, they should be addressed directly with the author for clarification.

## 2022-07-15 ENCOUNTER — Encounter (INDEPENDENT_AMBULATORY_CARE_PROVIDER_SITE_OTHER): Payer: Self-pay | Admitting: Cardiovascular Disease

## 2022-07-15 ENCOUNTER — Inpatient Hospital Stay
Payer: Commercial Managed Care - POS | Attending: Physician Assistant | Admitting: Rehabilitative and Restorative Service Providers"

## 2022-07-15 VITALS — BP 139/90 | HR 82

## 2022-07-15 DIAGNOSIS — M25521 Pain in right elbow: Secondary | ICD-10-CM | POA: Insufficient documentation

## 2022-07-15 NOTE — Progress Notes (Signed)
Name:Stephen Bishop Age: 61 y.o.   Date of Service: 07/15/2022  Referring Physician: Bryon Lions D, PA   Date of Injury: No data found 04/15/2022  PT Date Care Plan Established/Reviewed:07/15/2022  PT Date Treatment Started:07/15/2022  OT Date Care Plan Established/Reviewed: No data found  OT Date Treatment Started: No data found    (Historic) Date of Injury:No data was found  (Historic) Date Care Plan Established/Reviewed No data was found  No data was found  (Historic) Date Treatment Started No data was found No data was found    End of Certification Date: 10/12/2022  Sessions in Plan of Care: 10  Surgery Date: No data was found  MD Follow-up: No data was found  Medbridge Code: No data was found    Visit Count: 1   Diagnosis:    Diagnosis ICD-10-CM Associated Order   1. Pain in right elbow  M25.521           Subjective     History of Present Illness   History of Present Illness: Pt arrives to Pt with chief complaint of r elbow pain which started around September when he fell off a treadmill. He fell into a flexed elbow and fell onto chest. Pain was not immediate in elbow but pain started about one week later. He originally noted chest pain. Pain is located diffusely across anterior elbow. He does not have pani at rest but rather pain increases when he uses it. He has waited 6 weeks of rest but pain has stayed similar. He has had xray which was unremarkable. He has not been approved for an MRI.     Currently, fully extended elbow with resiance causes pain. He notes resisted supination will also increase pain. Pushing and pulling heavy objects will increase. Reaching back of head can increase pain. Denies clicking or popping. Denies numbness or tingling. He has not taken Nsaids. Avoiding activities is most effective way of avoiding pain as pain is not constant.     Functional Limitations (PLOF): Pt unable to open or close heavy doors with R UE (I prior pain free)  Pt unable to open jars (I prior)  Pt unable to  lift >5# with affected UE (I prior > 20#)        Social Support/Occupation                          Precautions: No data was found  Allergies: Sulfa antibiotics    Past Medical History:   Diagnosis Date    Arrhythmia 2015    Had an ablation in 12/2014    Mitral valve prolapse, nonrheumatic     Premature ventricular contraction        Objective     Active Range of Motion (degrees)     Left Elbow   Flexion: 145 degrees   Extension: 0 degrees   Forearm supination: 80 degrees   Forearm pronation: 65 degrees     Right Elbow   Flexion: 145 degrees with pain  Extension: 0 degrees   Forearm supination: 75 degrees   Forearm pronation: 55 degrees with pain    Strength/Myotome Testing (/5)     Left Shoulder     Planes of Motion   External rotation at 0 (C5): 4   Internal rotation at 0: 4     Right Shoulder     Planes of Motion   External rotation at 0 (C5): 4   Internal rotation at 0: 4  Left Elbow   Flexion: 34 PSI  Extension: 18 PSI  Forearm supination: 4+  Forearm pronation: 4+    Right Elbow   Flexion: 33.5 PSI  Extension: 19 PSI  Forearm supination: 4 and with pain  Forearm pronation: 4 and with pain    Left Wrist/Hand     Wrist extension: 4+  Wrist flexion: 4+    Grip (2nd hand position)     Trial 1: 90    Trial 2: 95    Average: 92.5    Right Wrist/Hand     Wrist extension: 4+  Wrist flexion: 4+ and with pain    Grip (2nd hand position)     Trial 1: 80    Trial 2: 80    Average: 80    Additional Strength Details  Pronation was the most painful     Pain with elbow flexion is more pronounced when in pronated position              BP: 139/90 Heart Rate: 82    Treatment     Therapeutic Activity - Justified to address the following:  Dynamic activities to improve functional performance.  Initiated and instructed pt on HEP, POC, and pathoanatomy       Home Exercises   Access Code: [email or web address]  URL: [email or web address]  Date: 07/15/2022  Prepared by: Dierdre Forth    Exercises  - Standing Bicep Curls Supinated  with Dumbbells  - 1 x daily - 2-4 x weekly - 3 sets - 10 reps  - Forearm Pronation and Supination with Hammer  - 1 x daily - 2-4 x weekly - 3 sets - 10 reps  - Wrist Flexion with Dumbbell  - 1 x daily - 7 x weekly - 3 sets - 10 reps  - Seated Eccentric Wrist Extension  - 1 x daily - 7 x weekly - 3 sets - 10 reps       ---      Flowsheet Row ---   Total Time    Timed Minutes 23 minutes   Untimed Minutes 23 minutes   Total Time 46 minutes          Assessment   Stephen Bishop is a 61 y.o. male presenting with R elbow pain consistent with elbow ligamentous sprain who requires Physical Therapy for the following:    Pain that limits and interferes with functional ability  Impaired postural alignment  Decreased range of motion  Decreased strength  Swelling/Edema/Effusion  Decreased functional stability  Decreased joint mobility  Decreased soft tissue mobility    Pain located: R lateral elbow     Clinical presentation: stable - predictable recovery pattern  Barriers to therapy: none  Functional Limitations (PLOF): Pt unable to open or close heavy doors with R UE (I prior pain free)  Pt unable to open jars (I prior)  Pt unable to lift >5# with affected UE (I prior > 20#)      Prognosis: good  Patient is aware of diagnosis, prognosis and consents to plan of care: Yes  Plan   Visits per week: 2  Number of Sessions: 10  Direct One on One  54098: Therapeutic Exercise: To Develop Strength and Endurance, ROM and Flexibility  [id number]: Neuromuscular Reeducation  97140: Manual Therapy techniques (mobilization, manipulation, manual traction) (Soft tissue mobilization and Joint Mobs Grade 1-5)  97530: Therapeutic Activities: Dynamic activities to improve functional performance  11914: Ultrasound  Dry Needling  Supervised Modalities  97010: Thermal modalities: hot/cold packs  97014: Electrical stimulation  97750: Physical performance test or measurement (eg, musculoskeletal, functional capacity).       Plan: gradually increase supination and  pronation ROM, elbow strength and stability, likely lateral elbow sprain       Goals      Goal 1: Patient will demonstrate independence in prescribed HEP with proper form, sets and reps for safe discharge to an independent program.   Sessions: 10      Goal 2: Patient will increase FOTO to >/= predicted value to demonstrate effective POC.      Sessions: 10      Goal 3: Pt will increase supination and pronation strength to 5/5 pain free to allow patient to open doors pain free.    Sessions: 10                                     Dierdre Forth, DPT

## 2022-07-22 ENCOUNTER — Ambulatory Visit (INDEPENDENT_AMBULATORY_CARE_PROVIDER_SITE_OTHER): Payer: Commercial Managed Care - POS | Admitting: Cardiovascular Disease

## 2022-07-22 ENCOUNTER — Encounter (INDEPENDENT_AMBULATORY_CARE_PROVIDER_SITE_OTHER): Payer: Self-pay | Admitting: Cardiovascular Disease

## 2022-07-22 VITALS — BP 120/84 | HR 79 | Ht 68.0 in | Wt 170.0 lb

## 2022-07-22 DIAGNOSIS — R002 Palpitations: Secondary | ICD-10-CM

## 2022-07-22 DIAGNOSIS — I493 Ventricular premature depolarization: Secondary | ICD-10-CM

## 2022-07-22 LAB — ECG 12-LEAD
Atrial Rate: 79 {beats}/min
IHS MUSE NARRATIVE AND IMPRESSION: NORMAL
P Axis: 45 degrees
P-R Interval: 150 ms
Q-T Interval: 382 ms
QRS Duration: 88 ms
QTC Calculation (Bezet): 438 ms
R Axis: -20 degrees
T Axis: 33 degrees
Ventricular Rate: 79 {beats}/min

## 2022-07-22 MED ORDER — METOPROLOL TARTRATE 25 MG PO TABS
25.0000 mg | ORAL_TABLET | Freq: Every evening | ORAL | 3 refills | Status: DC | PRN
Start: 2022-07-22 — End: 2023-02-11

## 2022-07-27 ENCOUNTER — Inpatient Hospital Stay
Payer: Commercial Managed Care - POS | Attending: Physician Assistant | Admitting: Rehabilitative and Restorative Service Providers"

## 2022-07-27 DIAGNOSIS — M25521 Pain in right elbow: Secondary | ICD-10-CM

## 2022-07-27 NOTE — PT/OT Therapy Note (Signed)
Name: Stephen Bishop Age: 62 y.o.   Date of Service: 07/27/2022  Referring Physician: Bryon Lions D, PA   Date of Injury: No data found 04/15/2022  PT Date Care Plan Established/Reviewed:07/15/2022  PT Date Treatment Started:07/15/2022  OT Date Care Plan Established/Reviewed: No data found  OT Date Treatment Started: No data found    (Historic) Date of Injury:No data was found  (Historic) Date Care Plan Established/Reviewed No data was found  No data was found  (Historic) Date Treatment Started No data was found No data was found    End of Certification Date: 10/12/2022  Sessions in Plan of Care: 10  Surgery Date: No data was found  MD Follow-up: No data was found  Medbridge Code: No data was found    Visit Count: 2   Diagnosis:    Diagnosis ICD-10-CM Associated Order   1. Pain in right elbow  M25.521                Subjective     Daily Subjective   Pt says he feels about the same. He feels like he has a hard time w/ lifting and working out. Pain w/ pronation.     Social Support/Occupation                          Precautions: No data was found  Allergies: Sulfa antibiotics    Objective                     Treatment     Therapeutic Exercises - Justified to address any of the following:  To develop strength, endurance, ROM and/or flexibility.   Subjective collection to guide treatment session    Manual stretching elbow and wrist all directions    Rows + ER GTB 2x12    Update HEP    Neuromuscular Re-Education - Justified to address any of the following:   Re-education of movement, balance, coordination, kinesthetic sense, posture and/or proprioception for sitting and/or standing activities.   Supine elbow flex/ext isometrics against PT    Seated pronation/supination 6# 3x15    GTBx2 tricep pulldown 3x12 R     Bicep curls Gtb 3x12    Ns/Us G flexbar 2x10 ea    Manual Therapy - Justified to address any of the following:    Mobilization of joints and soft tissues, manipulation, manual lymphatic drainage, and/or manual  traction.    Stm to surrounding elbow and forearm mm    Home Exercises   Access Code: [email or web address]  URL: [email or web address]  Date: 07/27/2022  Prepared by: Theressa Stamps    Exercises  - Standing Bicep Curls Supinated with Dumbbells  - 1 x daily - 2-4 x weekly - 3 sets - 10 reps  - Forearm Pronation and Supination with Hammer  - 1 x daily - 2-4 x weekly - 3 sets - 10 reps  - Wrist Flexion with Dumbbell  - 1 x daily - 7 x weekly - 3 sets - 10 reps  - Seated Eccentric Wrist Extension  - 1 x daily - 7 x weekly - 3 sets - 10 reps  - Shoulder External Rotation and Scapular Retraction with Resistance  - 1 x daily - 7 x weekly - 3 sets - 10 reps  - Standing Elbow Extension with Self-Anchored Resistance  - 1 x daily - 7 x weekly - 3 sets - 10 reps         Assessment  Pt presents to PT for 1st f/u w/ mild pain reported. PT session progressed elbow ROM as tolerated which was met w/ + response. He will benefit from cont skilled PT to address deficits and restore PLOF  Plan   Continue POC      Goals      Goal 1: Patient will demonstrate independence in prescribed HEP with proper form, sets and reps for safe discharge to an independent program.   Sessions: 10      Goal 2: Patient will increase FOTO to >/= predicted value to demonstrate effective POC.      Sessions: 10      Goal 3: Pt will increase supination and pronation strength to 5/5 pain free to allow patient to open doors pain free.    Sessions: [place], DPT

## 2022-07-30 ENCOUNTER — Inpatient Hospital Stay: Payer: Commercial Managed Care - POS | Admitting: Rehabilitative and Restorative Service Providers"

## 2022-08-04 ENCOUNTER — Inpatient Hospital Stay: Payer: Commercial Managed Care - POS | Admitting: Rehabilitative and Restorative Service Providers"

## 2022-08-12 ENCOUNTER — Inpatient Hospital Stay
Payer: Commercial Managed Care - POS | Attending: Physician Assistant | Admitting: Rehabilitative and Restorative Service Providers"

## 2022-08-12 DIAGNOSIS — M25521 Pain in right elbow: Secondary | ICD-10-CM

## 2022-08-12 NOTE — PT/OT Therapy Note (Signed)
Name: Vibhav Waddill Age: 62 y.o.   Date of Service: 08/12/2022  Referring Physician: Heywood Footman D, PA   Date of Injury: No data found 04/15/2022  PT Date Care Plan Established/Reviewed:07/15/2022  PT Date Treatment Started:07/15/2022  OT Date Care Plan Established/Reviewed: No data found  OT Date Treatment Started: No data found    (Historic) Date of Injury:No data was found  (Historic) Date Care Plan Established/Reviewed No data was found  No data was found  (Historic) Date Treatment Started No data was found No data was found    End of Certification Date: 01/06/4314  Sessions in Plan of Care: 10  Surgery Date: No data was found  MD Follow-up: No data was found  Medbridge Code: No data was found    Visit Count: 3   Diagnosis:    Diagnosis ICD-10-CM Associated Order   1. Pain in right elbow  M25.521                Subjective     Daily Subjective   Pt feels mildly better. Pain is mild, not sharp. Tightness w/ pronation.     Social Support/Occupation                          Precautions: No data was found  Allergies: Sulfa antibiotics    Objective                     Treatment     Therapeutic Exercises - Justified to address any of the following:  To develop strength, endurance, ROM and/or flexibility.   Subjective collection to guide treatment session    Manual stretching elbow and wrist all directions    UBE x2'fwd/2'bwd lvl 4 for mobility    Rows + ER GTB 2x12  VC execution      Neuromuscular Re-Education - Justified to address any of the following:   Re-education of movement, balance, coordination, kinesthetic sense, posture and/or proprioception for sitting and/or standing activities.   Supine elbow flex/ext isometrics against PT    Wrist flexion black TB 2x15 VC eccentric    BTB bicep curls w/ pronation 3x10    Ns/Us G flexbar 3x10 ea    Towel farmers carry 15# x5 laps R    VC execution      Manual Therapy - Justified to address any of the following:    Mobilization of joints and soft tissues, manipulation,  manual lymphatic drainage, and/or manual traction.    Stm to surrounding elbow and forearm mm    Home Exercises   Access Code: [email or web address]  URL: [email or web address]  Date: 07/27/2022  Prepared by: Reola Calkins    Exercises  - Standing Bicep Curls Supinated with Dumbbells  - 1 x daily - 2-4 x weekly - 3 sets - 10 reps  - Forearm Pronation and Supination with Hammer  - 1 x daily - 2-4 x weekly - 3 sets - 10 reps  - Wrist Flexion with Dumbbell  - 1 x daily - 7 x weekly - 3 sets - 10 reps  - Seated Eccentric Wrist Extension  - 1 x daily - 7 x weekly - 3 sets - 10 reps  - Shoulder External Rotation and Scapular Retraction with Resistance  - 1 x daily - 7 x weekly - 3 sets - 10 reps  - Standing Elbow Extension with Self-Anchored Resistance  - 1 x daily - 7 x weekly - 3 sets - 10  reps       ---      Flowsheet Row ---   Total Time    Timed Minutes 30 minutes   Total Time 30 minutes          Assessment   Pt arrived late to PT impacting treatment time. He has reducing pain but cont TTP to flexor origin and pain w/ pronation. He is progressing well w/ decreased pain post session. PT session focused on pronator and flexor strengthening. He will benefit from cont skilled PT to address deficits and restore PLOF  Plan   Continue POC      Goals      Goal 1: Patient will demonstrate independence in prescribed HEP with proper form, sets and reps for safe discharge to an independent program.   Sessions: 10      Goal 2: Patient will increase FOTO to >/= predicted value to demonstrate effective POC.      Sessions: 10      Goal 3: Pt will increase supination and pronation strength to 5/5 pain free to allow patient to open doors pain free.    Sessions: [place], DPT

## 2022-08-16 ENCOUNTER — Inpatient Hospital Stay: Payer: Commercial Managed Care - POS | Admitting: Rehabilitative and Restorative Service Providers"

## 2022-08-25 ENCOUNTER — Inpatient Hospital Stay: Payer: Commercial Managed Care - POS | Admitting: Rehabilitative and Restorative Service Providers"

## 2022-08-30 ENCOUNTER — Inpatient Hospital Stay: Payer: Commercial Managed Care - POS | Admitting: Rehabilitative and Restorative Service Providers"

## 2023-02-11 ENCOUNTER — Other Ambulatory Visit (INDEPENDENT_AMBULATORY_CARE_PROVIDER_SITE_OTHER): Payer: Self-pay

## 2023-02-11 MED ORDER — METOPROLOL TARTRATE 25 MG PO TABS
25.0000 mg | ORAL_TABLET | Freq: Every evening | ORAL | 3 refills | Status: DC | PRN
Start: 2023-02-11 — End: 2023-03-22

## 2023-02-11 NOTE — Telephone Encounter (Signed)
Lopressor as needed refilled at [facility]

## 2023-03-15 ENCOUNTER — Encounter (HOSPITAL_COMMUNITY): Payer: Commercial Managed Care - HMO

## 2023-03-15 DIAGNOSIS — I200 Unstable angina: Secondary | ICD-10-CM

## 2023-03-15 DIAGNOSIS — I249 Acute ischemic heart disease, unspecified: Secondary | ICD-10-CM

## 2023-03-15 DIAGNOSIS — I214 Non-ST elevation (NSTEMI) myocardial infarction: Secondary | ICD-10-CM

## 2023-03-16 ENCOUNTER — Encounter (INDEPENDENT_AMBULATORY_CARE_PROVIDER_SITE_OTHER): Payer: Self-pay

## 2023-03-16 ENCOUNTER — Other Ambulatory Visit (INDEPENDENT_AMBULATORY_CARE_PROVIDER_SITE_OTHER): Payer: Self-pay | Admitting: Physician Assistant

## 2023-03-16 DIAGNOSIS — I214 Non-ST elevation (NSTEMI) myocardial infarction: Secondary | ICD-10-CM

## 2023-03-16 DIAGNOSIS — I493 Ventricular premature depolarization: Secondary | ICD-10-CM

## 2023-03-16 DIAGNOSIS — I2510 Atherosclerotic heart disease of native coronary artery without angina pectoris: Secondary | ICD-10-CM

## 2023-03-17 ENCOUNTER — Telehealth (INDEPENDENT_AMBULATORY_CARE_PROVIDER_SITE_OTHER): Payer: Self-pay

## 2023-03-17 NOTE — Telephone Encounter (Signed)
Stephen Bishop called in follow up to recent hospital stay for MI and s/p cardiac stent. He is inquiring about cardiac rehab and order. I advised he needs follow up visit first and then this will all be discussed and order placed at that time.   He verbalized understanding.

## 2023-03-22 ENCOUNTER — Ambulatory Visit (INDEPENDENT_AMBULATORY_CARE_PROVIDER_SITE_OTHER): Payer: Commercial Managed Care - HMO | Admitting: Nurse Practitioner

## 2023-03-22 ENCOUNTER — Encounter (INDEPENDENT_AMBULATORY_CARE_PROVIDER_SITE_OTHER): Payer: Self-pay | Admitting: Nurse Practitioner

## 2023-03-22 VITALS — BP 110/74 | HR 62 | Ht 68.0 in | Wt 164.0 lb

## 2023-03-22 DIAGNOSIS — I214 Non-ST elevation (NSTEMI) myocardial infarction: Secondary | ICD-10-CM

## 2023-03-22 DIAGNOSIS — R002 Palpitations: Secondary | ICD-10-CM

## 2023-03-22 LAB — ECG 12-LEAD
Atrial Rate: 62 {beats}/min
P Axis: 53 degrees
P-R Interval: 154 ms
Q-T Interval: 410 ms
QRS Duration: 86 ms
QTC Calculation (Bezet): 416 ms
R Axis: -15 degrees
T Axis: 28 degrees
Ventricular Rate: 62 {beats}/min

## 2023-03-22 MED ORDER — ATORVASTATIN CALCIUM 80 MG PO TABS
80.0000 mg | ORAL_TABLET | Freq: Every day | ORAL | 3 refills | Status: AC
Start: 2023-03-22 — End: ?

## 2023-03-22 MED ORDER — TICAGRELOR 90 MG PO TABS
90.0000 mg | ORAL_TABLET | Freq: Two times a day (BID) | ORAL | 3 refills | Status: AC
Start: 2023-03-22 — End: ?

## 2023-03-22 MED ORDER — METOPROLOL SUCCINATE ER 50 MG PO TB24
50.0000 mg | ORAL_TABLET | Freq: Every day | ORAL | 3 refills | Status: AC
Start: 2023-03-22 — End: ?

## 2023-03-22 MED ORDER — NITROGLYCERIN 0.4 MG SL SUBL
0.4000 mg | SUBLINGUAL_TABLET | SUBLINGUAL | 4 refills | Status: AC | PRN
Start: 2023-03-22 — End: ?

## 2023-03-22 MED ORDER — ASPIRIN 81 MG PO TBEC
81.0000 mg | DELAYED_RELEASE_TABLET | Freq: Every day | ORAL | 3 refills | Status: DC
Start: 2023-03-22 — End: 2024-03-28

## 2023-03-22 NOTE — Progress Notes (Signed)
[facility] OFF[facility] PROGRESS NOTE    [facility] OFFICE -CARDIOLOGY  [address]  [place] [address]  Dept: [phone]  Dept Fax: [phone]       Patient Name: Stephen Bishop    Date of Visit:  March 22, 2023  Date of Birth: [birth year]  AGE: 62 y.o.  Medical Record #: [id number]  Requesting Physician: Driscilla Grammes, MD      CHIEF COMPLAINT: Coronary Artery Disease (History of CAD s/p DES; hospital follow up)      HISTORY OF PRESENT ILLNESS:    He is a pleasant 61 y.o. male who presents today for CAD status post stenting.    He presented with chest discomfort that had been going on for most of 1 day.  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  An extended monitor in 2023 showed 6% PVC burden.  He had a calcium score in December 2022 that was 9.  He has not bladder cancer and had a procedure just a few weeks ago.    The patient presented with unstable angina and was found to have an 80% LAD lesion.  This was single-vessel disease.  He received a drug-eluting stent.  He is on aspirin Brilinta and high-dose statin.  He is tolerating the medications well.  He has not had chest pain or shortness of breath.  He has gone for a walk but has not resumed his normal exercise.  He did have an LP(a) at the hospital that was normal.    His echo had a normal LV function with no wall motion abnormalities and no valvular abnormalities.      PAST MEDICAL HISTORY: He has a past medical history of Arrhythmia (2015), Coronary artery disease (03/15/2023), Mitral valve prolapse, nonrheumatic, Myocardial infarction (03/15/2023), Premature ventricular contraction, and Stented coronary artery (03/15/2023). He has a past surgical history that includes Urethral dilation; Hernia repair; APPENDECTOMY (OPEN); Cholecystectomy; Cardiac electrophysiology study and ablation (12/2014); and Cardiac catheterization  (03/15/2023).    Allergies  Reviewed by Griffin Basil, NP on 03/22/2023        Severity Reactions Comments    Sulfa Antibiotics Low Rash Other reaction(s): Intolerance-unknown             MEDICATIONS:   Patient's current medications were reviewed. ONLY Cardiac medications were updated unless others were addressed in assessment and plan.    Current Outpatient Medications   Medication Instructions    ALPRAZolam (NIRAVAM) 0.5 mg, Oral, As needed    ASPIRIN 81 PO Oral, Daily    atorvastatin (LIPITOR) 80 mg, Oral, Daily    famotidine (PEPCID) 20 mg, Oral    MAGNESIUM OXIDE PO 500 mg, Oral, Daily    metoprolol succinate XL (TOPROL-XL) 50 mg, Oral, Daily    nitroglycerin (NITROSTAT) 0.4 mg, Sublingual, Every 5 min PRN    ticagrelor (BRILINTA) 90 mg, Oral, Every 12 hours    Vitamin C 500 mg, Oral, Daily       FAMILY HISTORY: family history includes Atrial fibrillation in his father and mother; Mitral valve prolapse in his father; Valve Surgery in his brother.    SOCIAL HISTORY: He reports that he has never smoked. He has never used smokeless tobacco. He reports current alcohol use of about 2.0 standard drinks of alcohol per week. He reports that he does not use drugs.    PHYSICAL EXAMINATION    Visit  Vitals  BP 110/74 (BP Site: Right arm, Patient Position: Sitting)   Pulse 62   Ht 1.727 m (5\' 8" )   Wt 74.4 kg (164 lb)   BMI 24.94 kg/m       Constitutional: Cooperative, alert, no acute distress.  Neck: No carotid bruits, JVP normal.  Cardiac: Regular rate and rhythm, normal S1 and S2; no S3 or S4. No murmurs. No rubs, no gallops.  Pulmonary: Clear to auscultation bilaterally, no wheezing, no rhonchi, no rales.  Extremities: no edema.  Vascular: +2 pulses in radial artery bilaterally, 2+ pedal pulses bilaterally.    ECG: Sinus rhythm with a PVC      LABS REVIEWED:   No results found for: "WBC", "HGB", "HCT", "PLT"  No results found for: "GLU", "BUN", "CREAT", "NA", "K", "CL", "CO2", "AST", "ALT"  No results found for:  "MG", "TSH", "HGBA1C", "BNP"  No results found for: "CHOL", "TRIG", "HDL", "LDL", "LPACHOL"      Reviewed records from [facility] including consults, labs, procedures and discharge summary      IMPRESSION:   Mr. Brouillet is a 62 y.o. male with the following problems:    CAD status post DES to an 80% proximal LAD  Circumflex was normal and the RCA had minimal irregularities  Echocardiogram with normal LV function, no WMA, no valvular dysfunction  PVCs with an ablation in 2016 at [facility]      RECOMMENDATIONS:    He is going to continue on his current medications.  He was instructed not to miss any doses of Brilinta or aspirin.  He is undergoing treatment for bladder cancer and is scheduled to have a cystoscopy in November.  Will have the patient into see Dr. Donnie Coffin prior to that.    He will attend cardiac rehab.  He will follow a Mediterranean diet.  We talked about not using nonsteroidal anti-inflammatories.  He will use Tylenol.  We discussed the proper use of nitroglycerin sublingual.    He is planning to travel and there is no contraindication.    He understands to contact our office with any new symptoms.                                                     Orders Placed This Encounter   Procedures    ECG 12 lead (Normal)       No orders of the defined types were placed in this encounter.      SIGNED:    Griffin Basil, NP          This note was generated by the Dragon speech recognition and may contain errors or omissions not intended by the user. Grammatical errors, random word insertions, deletions, pronoun errors, and incomplete sentences are occasional consequences of this technology due to software limitations. Not all errors are caught or corrected. If there are questions or concerns about the content of this note or information contained within the body of this dictation, they should be addressed directly with the author for clarification.

## 2023-03-24 ENCOUNTER — Telehealth (INDEPENDENT_AMBULATORY_CARE_PROVIDER_SITE_OTHER): Payer: Self-pay

## 2023-03-24 NOTE — Telephone Encounter (Signed)
Call received from [place], RN [facility] cardiac Rehab. Stated patient would like to urgently start Cardiac rehab and order was placed by gen cards. They are requesting an updated order from Dr. Donnie Coffin with code Z95.5 for Stent (to replace acute coronary syndrome). Patient saw Arville Care, NP on 03/22/23 and I advised I'd be happy to send order but she stated that it needs to come from Physician only. Fax number [phone]. Advised I will reach out to [facility] office to send updated cardiac rehab order.

## 2023-03-25 ENCOUNTER — Telehealth (INDEPENDENT_AMBULATORY_CARE_PROVIDER_SITE_OTHER): Payer: Self-pay

## 2023-03-25 ENCOUNTER — Other Ambulatory Visit (INDEPENDENT_AMBULATORY_CARE_PROVIDER_SITE_OTHER): Payer: Self-pay | Admitting: Cardiovascular Disease

## 2023-03-25 DIAGNOSIS — I2510 Atherosclerotic heart disease of native coronary artery without angina pectoris: Secondary | ICD-10-CM

## 2023-03-25 DIAGNOSIS — Z955 Presence of coronary angioplasty implant and graft: Secondary | ICD-10-CM

## 2023-03-25 NOTE — Progress Notes (Signed)
Order placed for cardiac rehab.  Michell Heinrich MD

## 2023-03-25 NOTE — Telephone Encounter (Signed)
Faxed order to, [employer], [phone].

## 2023-03-25 NOTE — Telephone Encounter (Signed)
-----   Message from Nicholson sent at 03/24/2023 10:49 AM EDT -----  Regarding: Cardiac Rehab order  Hi Guys,   Please see my telephone encounter today [facility] cardiac Rehab. Can you assist with getting new order sent for this patient?           "Call received from [place], RN [facility] cardiac Rehab. Stated patient would like to urgently start     Cardiac rehab and order was placed by gen cards. They are requesting an updated order from Dr. Donnie Coffin with code Z95.5 for Stent (to replace acute coronary syndrome). Patient saw Arville Care, NP on 03/22/23 and I advised I'd be happy to send order but she stated that it needs to come from Physician only. Fax number [phone]. Advised I will reach out to [facility] office to send updated cardiac rehab order. "    Many thanks,  Jasmine, CT

## 2023-03-29 ENCOUNTER — Encounter (INDEPENDENT_AMBULATORY_CARE_PROVIDER_SITE_OTHER): Payer: Self-pay

## 2023-03-29 NOTE — Telephone Encounter (Signed)
Received call from [facility] at [facility] Cardiac Rehab requesting referral for pt, states pt is calling every day to be scheduled. Informed referral was sent 8/30 to  [phone]. Marielle v/u, states it has to be on their specific form that she sent to office. I informed to send again to confirmed fax number and I will let office know. Marielle v/u, appreciative of help, would like it filled out and sent to [phone].

## 2023-03-29 NOTE — Telephone Encounter (Signed)
Referral form scanned into chart. Will ask Dr Donnie Coffin when he is office on thurs to sign.

## 2023-03-31 NOTE — Telephone Encounter (Signed)
Referral faxed over to [phone]

## 2023-04-25 ENCOUNTER — Encounter (INDEPENDENT_AMBULATORY_CARE_PROVIDER_SITE_OTHER): Payer: Self-pay

## 2023-06-09 ENCOUNTER — Ambulatory Visit (INDEPENDENT_AMBULATORY_CARE_PROVIDER_SITE_OTHER): Payer: Commercial Managed Care - HMO | Admitting: Cardiovascular Disease

## 2024-01-05 ENCOUNTER — Encounter (INDEPENDENT_AMBULATORY_CARE_PROVIDER_SITE_OTHER): Payer: Self-pay

## 2024-03-24 ENCOUNTER — Other Ambulatory Visit (INDEPENDENT_AMBULATORY_CARE_PROVIDER_SITE_OTHER): Payer: Self-pay | Admitting: Nurse Practitioner

## 2024-03-24 DIAGNOSIS — Z955 Presence of coronary angioplasty implant and graft: Secondary | ICD-10-CM

## 2024-03-24 DIAGNOSIS — I2510 Atherosclerotic heart disease of native coronary artery without angina pectoris: Secondary | ICD-10-CM

## 2024-03-28 NOTE — Telephone Encounter (Signed)
 Sent 90 day courtesy refill of    To pharmacy with note to pharmacy to call and schedule overdue follow up for further refills.

## 2024-06-27 ENCOUNTER — Other Ambulatory Visit (INDEPENDENT_AMBULATORY_CARE_PROVIDER_SITE_OTHER): Payer: Self-pay | Admitting: Nurse Practitioner

## 2024-06-27 DIAGNOSIS — Z955 Presence of coronary angioplasty implant and graft: Secondary | ICD-10-CM

## 2024-06-27 DIAGNOSIS — I2510 Atherosclerotic heart disease of native coronary artery without angina pectoris: Secondary | ICD-10-CM
